# Patient Record
Sex: Female | Born: 1965 | Race: White | Hispanic: No | Marital: Married | Smoking: Never smoker
Health system: Southern US, Community
[De-identification: ages and names within clinical notes are randomized; demographics above are authoritative.]

## PROBLEM LIST (undated history)

## (undated) DIAGNOSIS — N200 Calculus of kidney: Secondary | ICD-10-CM

## (undated) DIAGNOSIS — F329 Major depressive disorder, single episode, unspecified: Secondary | ICD-10-CM

## (undated) DIAGNOSIS — F419 Anxiety disorder, unspecified: Secondary | ICD-10-CM

## (undated) DIAGNOSIS — G43909 Migraine, unspecified, not intractable, without status migrainosus: Secondary | ICD-10-CM

## (undated) DIAGNOSIS — I10 Essential (primary) hypertension: Secondary | ICD-10-CM

## (undated) DIAGNOSIS — E039 Hypothyroidism, unspecified: Secondary | ICD-10-CM

## (undated) DIAGNOSIS — Z9889 Other specified postprocedural states: Secondary | ICD-10-CM

## (undated) DIAGNOSIS — F32A Depression, unspecified: Secondary | ICD-10-CM

## (undated) DIAGNOSIS — K219 Gastro-esophageal reflux disease without esophagitis: Secondary | ICD-10-CM

## (undated) HISTORY — DX: Calculus of kidney: N20.0

## (undated) HISTORY — DX: Migraine, unspecified, not intractable, without status migrainosus: G43.909

## (undated) HISTORY — DX: Essential (primary) hypertension: I10

## (undated) HISTORY — DX: Other specified postprocedural states: Z98.890

## (undated) HISTORY — DX: Gastro-esophageal reflux disease without esophagitis: K21.9

## (undated) HISTORY — PX: DILATION AND CURETTAGE OF UTERUS: SHX78

## (undated) HISTORY — PX: URETEROSCOPY WITH HOLMIUM LASER LITHOTRIPSY: SHX6645

---

## 1999-03-04 ENCOUNTER — Other Ambulatory Visit: Admission: RE | Admit: 1999-03-04 | Discharge: 1999-03-04 | Payer: Self-pay | Admitting: Obstetrics & Gynecology

## 2000-08-25 ENCOUNTER — Other Ambulatory Visit: Admission: RE | Admit: 2000-08-25 | Discharge: 2000-08-25 | Payer: Self-pay | Admitting: Obstetrics & Gynecology

## 2001-11-30 ENCOUNTER — Other Ambulatory Visit: Admission: RE | Admit: 2001-11-30 | Discharge: 2001-11-30 | Payer: Self-pay | Admitting: Obstetrics & Gynecology

## 2005-09-02 ENCOUNTER — Other Ambulatory Visit: Admission: RE | Admit: 2005-09-02 | Discharge: 2005-09-02 | Payer: Self-pay | Admitting: Obstetrics & Gynecology

## 2005-12-15 HISTORY — PX: LITHOTRIPSY: SUR834

## 2006-09-11 ENCOUNTER — Emergency Department (HOSPITAL_COMMUNITY): Admission: EM | Admit: 2006-09-11 | Discharge: 2006-09-11 | Payer: Self-pay | Admitting: Emergency Medicine

## 2006-10-06 ENCOUNTER — Ambulatory Visit (HOSPITAL_BASED_OUTPATIENT_CLINIC_OR_DEPARTMENT_OTHER): Admission: RE | Admit: 2006-10-06 | Discharge: 2006-10-06 | Payer: Self-pay | Admitting: Urology

## 2009-12-15 DIAGNOSIS — Z9889 Other specified postprocedural states: Secondary | ICD-10-CM

## 2009-12-15 HISTORY — DX: Other specified postprocedural states: Z98.890

## 2010-05-01 ENCOUNTER — Ambulatory Visit: Payer: Self-pay | Admitting: Internal Medicine

## 2010-05-03 ENCOUNTER — Encounter: Payer: Self-pay | Admitting: Internal Medicine

## 2010-09-27 ENCOUNTER — Encounter: Admission: RE | Admit: 2010-09-27 | Discharge: 2010-09-27 | Payer: Self-pay | Admitting: Cardiovascular Disease

## 2010-10-02 ENCOUNTER — Ambulatory Visit (HOSPITAL_COMMUNITY): Admission: RE | Admit: 2010-10-02 | Discharge: 2010-10-02 | Payer: Self-pay | Admitting: Cardiovascular Disease

## 2011-01-16 NOTE — Miscellaneous (Signed)
Summary: Orders Update pft charges  Clinical Lists Changes  Orders: Added new Service order of Carbon Monoxide diffusing w/capacity (94720) - Signed Added new Service order of Lung Volumes (94240) - Signed Added new Service order of Spirometry (Pre & Post) (94060) - Signed 

## 2011-02-24 ENCOUNTER — Ambulatory Visit (HOSPITAL_COMMUNITY): Payer: BC Managed Care – PPO | Attending: Internal Medicine

## 2011-02-24 DIAGNOSIS — R0609 Other forms of dyspnea: Secondary | ICD-10-CM

## 2011-02-24 DIAGNOSIS — R0602 Shortness of breath: Secondary | ICD-10-CM | POA: Insufficient documentation

## 2011-02-24 DIAGNOSIS — R0989 Other specified symptoms and signs involving the circulatory and respiratory systems: Secondary | ICD-10-CM

## 2011-02-26 LAB — PREGNANCY, URINE: Preg Test, Ur: NEGATIVE

## 2011-05-02 NOTE — Op Note (Signed)
NAMENINI, CAVAN              ACCOUNT NO.:  [id number]   MEDICAL RECORD NO.:  [id number]          PATIENT TYPE:  AMB   LOCATION:  [facility]                         FACILITY:  [facility]   PHYSICIAN:  [place] Service, M.D.DATE OF BIRTH:  1966/08/24   DATE OF PROCEDURE:  10/06/2006  DATE OF DISCHARGE:                                 OPERATIVE REPORT   PREOPERATIVE DIAGNOSIS:  A 4 x 5 mm left ureterovesical junction stone.   POSTOPERATIVE DIAGNOSIS:  A 4 x 5 mm left ureterovesical junction stone.  Intractable nausea, vomiting and pain.   PROCEDURE:  Cystoscopy, ureteroscopy, retrograde and stone manipulation.   SURGEON:  Boston Service, M.D.   ASSISTANT:  None.   ANESTHESIA:  General.   SPECIMENS:  A 4 x 5 mm stone removed intact.   ESTIMATED BLOOD LOSS:  Minimal.   COMPLICATIONS:  None obvious.   DESCRIPTION OF PROCEDURE:  The patient was prepped and draped in the dorsal  lithotomy position after institution of adequate level of general  anesthesia.  A well lubricated 21-French panendoscope was gently inserted at  the urethral meatus, normal urethra and sphincter.  Clear reflux right  orifice, minimal reflux left orifice.   Retrograde blocking catheter was selected, positioned at the right ureteral  orifice.  Normal course and caliber of the ureter, pelvis and calyces with  prompt drainage at 3-5 minutes.  Left retrograde was performed using similar  technique, 5 mm filling defect was identified within the distal ureter with  proximal hydronephrosis.  Guidewire was negotiated beyond the filling  defect, coiled nicely in the upper pole calyces.  There was prompt efflux of  concentrated urine from the left ureteral orifice.  A short 6-French  ureteroscope was then inserted.  Basket was used,  stone was negotiated into the basket and then gently withdrawn.  Ureteroscope was then reinserted.  There was no evidence of ureteral trauma  or retained stony fragments.  Ureteroscope  was then gently withdrawn.  Bladder was drained.  Cystoscope was removed.  The patient was returned to  recovery in satisfactory condition.           ______________________________  Boston Service, M.D.     RH/MEDQ  D:  10/06/2006  T:  10/07/2006  Job:  [id number]   cc:   Ilda Mori, M.D.  Fax: [phone]   Juline Patch, M.D.  Fax: [phone]

## 2015-07-02 ENCOUNTER — Other Ambulatory Visit: Payer: Self-pay | Admitting: Internal Medicine

## 2015-07-02 DIAGNOSIS — R945 Abnormal results of liver function studies: Secondary | ICD-10-CM

## 2015-07-05 ENCOUNTER — Ambulatory Visit
Admission: RE | Admit: 2015-07-05 | Discharge: 2015-07-05 | Disposition: A | Discharge status: Home / Self Care | Payer: BLUE CROSS/BLUE SHIELD | Source: Ambulatory Visit | Attending: Internal Medicine | Admitting: Internal Medicine

## 2015-07-05 DIAGNOSIS — R945 Abnormal results of liver function studies: Secondary | ICD-10-CM

## 2015-08-16 ENCOUNTER — Ambulatory Visit (INDEPENDENT_AMBULATORY_CARE_PROVIDER_SITE_OTHER): Payer: BLUE CROSS/BLUE SHIELD | Admitting: Internal Medicine

## 2015-08-16 ENCOUNTER — Encounter: Payer: Self-pay | Admitting: Internal Medicine

## 2015-08-16 VITALS — BP 128/88 | HR 74 | Ht 62.0 in | Wt 170.0 lb

## 2015-08-16 DIAGNOSIS — R0689 Other abnormalities of breathing: Secondary | ICD-10-CM

## 2015-08-16 DIAGNOSIS — R230 Cyanosis: Secondary | ICD-10-CM

## 2015-08-16 DIAGNOSIS — R0600 Dyspnea, unspecified: Secondary | ICD-10-CM | POA: Diagnosis not present

## 2015-08-16 NOTE — Progress Notes (Signed)
Subjective:    Patient ID: Shelley Zimmerman, female    DOB: 1966-06-09, 49 y.o.   MRN: [id number]  HPI PC Shelley Gravel, MD Cards - Milinda Pointer 08/16/2015  Chief Complaint  Patient presents with  . Pulmonary Consult    Pt referred by Dr. Maudie Mercury for DOE x 6 years. Pt c/o throat clearing and chest tightness with exertion.     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 Here for dyspnea evaluation. Insidious onset of dyspnea since 2011. Back in 2011 she recollects pulmonary function tests, cardiac stress test, possibly cardiopulmonary stress test but apparently all these were normal. These electronic medical records are not fully available to me. A limited addition it was available in the old chart showed that she had a positive cardiac stress test and was then subjected to cardiac catheterization by Dr. Donnella Bi in 2011 and this was normal along with normal renal arteries and left ventricular ejection fraction. Since then she's gained 20-30 pounds and the dyspnea continues. Dyspnea is of moderate intensity. Brought on with heat, exertion but relieved by rest. Previous albuterol therapy has not helped. There is no associated wheezing or cough or orthopnea paroxysmal nocturnal dyspnea or edema or chest pain.  Associated with this she has some mild occasional spring allergies but she is also reporting a chronic history of random onset of blue lips, blue fingers and blue toes that happen intermittently. She brought this to the attention of her primary care physician Dr. Jani Zimmerman recently on visit 06/28/2015. This is been going on for 10 years or so. This history was confirmed on review of his referral note dated 06/28/2015. Apparently a lot of blood work was done but she does not know the results. It is unclear if autoimmune lab tests were done.  She's also been diagnosed with mildly elevated transaminases on July 2016 and an ultrasound same month 2016 shows fatty liver. Apparently she had a cardiac  echo by Dr. Christen Butter and she supposed to have a leaky mitral valve. That result is not available to me  She supposedly had a chest x-ray and this result is unavailable to me but apparently there was something in the lung base. Review of chest x-ray 2007 that I personally visualized and also CT scan abdomen lung cut shows clear pulmonary parenchyma at that time  Lab work shows normal creatinine July 2016 normal CBC July 2016 and normal thyroid function July 2016 - PRIMARY care physician  She is entering menopause and denies being on any hormonal pills     has a past medical history of Hypertension; S/P cardiac cath (2011); Kidney stones; GERD (gastroesophageal reflux disease); and Migraine.   reports that she has never smoked. She has never used smokeless tobacco.  Past Surgical History  Procedure Laterality Date  . Lithotripsy  2007    Allergies  Allergen Reactions  . Diovan [Valsartan]     Facial swelling     There is no immunization history on file for this patient.  Family History  Problem Relation Age of Onset  . Diabetes Mellitus II Father   . Hypertension Father   . Hypothyroidism Sister      Current outpatient prescriptions:  .  Cholecalciferol (VITAMIN D3) 5000 UNITS CAPS, Take by mouth daily., Disp: , Rfl:  .  magnesium oxide (MAG-OX) 400 MG tablet, Take 400 mg by mouth daily., Disp: , Rfl:  .  Multiple Vitamin (MULTIVITAMIN) capsule, Take 1 capsule by mouth daily., Disp: , Rfl:  .  olmesartan (BENICAR) 40 MG tablet, Take 40 mg by mouth daily., Disp: , Rfl:  .  valACYclovir (VALTREX) 1000 MG tablet, Take 1,000 mg by mouth 2 (two) times daily as needed., Disp: , Rfl:      Review of Systems  Constitutional: Negative for fever and unexpected weight change.  HENT: Negative for congestion, dental problem, ear pain, nosebleeds, postnasal drip, rhinorrhea, sinus pressure, sneezing, sore throat and trouble swallowing.   Eyes: Negative for redness and itching.    Respiratory: Positive for cough, chest tightness and shortness of breath. Negative for wheezing.   Cardiovascular: Negative for palpitations and leg swelling.  Gastrointestinal: Negative for nausea and vomiting.  Genitourinary: Negative for dysuria.  Musculoskeletal: Negative for joint swelling.  Skin: Negative for rash.  Neurological: Negative for headaches.  Hematological: Does not bruise/bleed easily.  Psychiatric/Behavioral: Negative for dysphoric mood. The patient is not nervous/anxious.        Objective:   Physical Exam  Constitutional: She is oriented to person, place, and time. She appears well-developed and well-nourished. No distress.  Body mass index is 31.09 kg/(m^2).   HENT:  Head: Normocephalic and atraumatic.  Right Ear: External ear normal.  Left Ear: External ear normal.  Mouth/Throat: Oropharynx is clear and moist. No oropharyngeal exudate.  Eyes: Conjunctivae and EOM are normal. Pupils are equal, round, and reactive to light. Right eye exhibits no discharge. Left eye exhibits no discharge. No scleral icterus.  Neck: Normal range of motion. Neck supple. No JVD present. No tracheal deviation present. No thyromegaly present.  Cardiovascular: Normal rate, regular rhythm, normal heart sounds and intact distal pulses.  Exam reveals no gallop and no friction rub.   No murmur heard. Pulmonary/Chest: Effort normal and breath sounds normal. No respiratory distress. She has no wheezes. She has no rales. She exhibits no tenderness.  Abdominal: Soft. Bowel sounds are normal. She exhibits no distension and no mass. There is no tenderness. There is no rebound and no guarding.  Visceral obesity +  Musculoskeletal: Normal range of motion. She exhibits no edema or tenderness.  Lymphadenopathy:    She has no cervical adenopathy.  Neurological: She is alert and oriented to person, place, and time. She has normal reflexes. No cranial nerve deficit. She exhibits normal muscle tone.  Coordination normal.  Skin: Skin is warm and dry. No rash noted. She is not diaphoretic. No erythema. No pallor.  Psychiatric: She has a normal mood and affect. Her behavior is normal. Judgment and thought content normal.  Vitals reviewed.   Filed Vitals:   08/16/15 0949  BP: 128/88  Pulse: 74  Height: 5\' 2"  (1.575 m)  Weight: 170 lb (77.111 kg)  SpO2: 99%         Assessment & Plan:     ICD-9-CM ICD-10-CM   1. Dyspnea and respiratory abnormality 786.09 R06.00 Pulmonary Function Test    R06.89   2. Blue lips 782.5 R23.0    Unclear cause of dyspnea but obesity, physical deconditioning and diastolic dysfunction are classic in the settinged clinical profile. Exercise induced bronchospasm could be another possibility. The abnormal chest x-ray she describes a primary care physician could reflect basilar atelectasis or early interstitial lung disease especially in the context of female, age 58s and history of Raynaud  Pretest probably ready for pulmonary embolism is low  Plan  - We'll check with primary care physician as dictated autoimmune profile if not we will check that  - Start with pulmonary function test - if these are normal  we'll proceed with methacholine challenge test, high-resolution CT scan of the chest and then subsequently cardiopulmonary stress test in that sequential order.   -She will call our office after every test to discuss the next step in her workup. She is agreeable with this plan       Dr. Brand Males, M.D., [facility].C.P Pulmonary and Critical Care Medicine Staff Physician [place] [facility] Pager: [phone], If no answer or between  15:00h - 7:00h: call [phone]  08/16/2015 10:53 AM

## 2015-08-16 NOTE — Patient Instructions (Addendum)
ICD-9-CM ICD-10-CM   1. Dyspnea and respiratory abnormality 786.09 R06.00     R06.89   2. Blue lips 782.5 R23.0     - do full PFT - cal [place] back [phone] to inform and then we will discuss and inform next step  - might involve CT chest or methacholine challenge test or CPST bike test  - My nurse  Will check with PCP [name], MD if you had autoimmune workup  - if not,  We will order this for you  Followup  - cal [place] after PFT test

## 2015-08-23 ENCOUNTER — Ambulatory Visit (HOSPITAL_COMMUNITY)
Admission: RE | Admit: 2015-08-23 | Discharge: 2015-08-23 | Disposition: A | Discharge status: Home / Self Care | Payer: BLUE CROSS/BLUE SHIELD | Source: Ambulatory Visit | Attending: Internal Medicine | Admitting: Internal Medicine

## 2015-08-23 ENCOUNTER — Telehealth: Payer: Self-pay | Admitting: Internal Medicine

## 2015-08-23 DIAGNOSIS — R0689 Other abnormalities of breathing: Secondary | ICD-10-CM | POA: Insufficient documentation

## 2015-08-23 DIAGNOSIS — R0600 Dyspnea, unspecified: Secondary | ICD-10-CM

## 2015-08-23 LAB — PULMONARY FUNCTION TEST
DL/VA % pred: 93 %
DL/VA: 4.29 ml/min/mmHg/L
DLCO UNC: 17.53 ml/min/mmHg
DLCO unc % pred: 78 %
FEF 25-75 Post: 4.04 L/sec
FEF 25-75 Pre: 5.13 L/sec
FEF2575-%Change-Post: -21 %
FEF2575-%PRED-PRE: 189 %
FEF2575-%Pred-Post: 149 %
FEV1-%CHANGE-POST: -3 %
FEV1-%Pred-Post: 107 %
FEV1-%Pred-Pre: 110 %
FEV1-Post: 2.88 L
FEV1-Pre: 2.97 L
FEV1FVC-%Change-Post: 0 %
FEV1FVC-%Pred-Pre: 115 %
FEV6-%CHANGE-POST: -2 %
FEV6-%Pred-Post: 95 %
FEV6-%Pred-Pre: 97 %
FEV6-Post: 3.15 L
FEV6-Pre: 3.22 L
FEV6FVC-%PRED-POST: 102 %
FEV6FVC-%Pred-Pre: 102 %
FVC-%Change-Post: -2 %
FVC-%Pred-Post: 93 %
FVC-%Pred-Pre: 95 %
FVC-Post: 3.15 L
FVC-Pre: 3.23 L
PRE FEV1/FVC RATIO: 92 %
Post FEV1/FVC ratio: 92 %
Post FEV6/FVC ratio: 100 %
Pre FEV6/FVC Ratio: 100 %
RV % pred: 83 %
RV: 1.42 L
TLC % pred: 91 %
TLC: 4.4 L

## 2015-08-23 MED ORDER — ALBUTEROL SULFATE (2.5 MG/3ML) 0.083% IN NEBU
2.5000 mg | INHALATION_SOLUTION | Freq: Once | RESPIRATORY_TRACT | Status: AC
  Administered 2015-08-23: 2.5 mg via RESPIRATORY_TRACT

## 2015-08-23 NOTE — Telephone Encounter (Signed)
Left detailed message on voicemail advising patient that we would contact her when Dr. Chase Caller has reviewed her PFT from today.  Nothing further needed.

## 2015-08-29 ENCOUNTER — Telehealth: Payer: Self-pay | Admitting: Internal Medicine

## 2015-08-29 DIAGNOSIS — R0600 Dyspnea, unspecified: Secondary | ICD-10-CM

## 2015-08-29 NOTE — Telephone Encounter (Signed)
Called Dr. Julianne Rice office at [phone] and left message for Lattie Haw to see if pt has had an autoimmune panel at that office.

## 2015-08-30 NOTE — Telephone Encounter (Signed)
Pt calling to let dr know that she had test done and is wanting to know results when they come in, please advise she can be reached @ [phone].Shelley Zimmerman

## 2015-08-30 NOTE — Telephone Encounter (Signed)
Called and spoke with pt Pt inquiring about PFT results, not allergy test Informed pt that MR is on vacation this week and would review once he returns to office Pt upset stating she doesn't understand why she has waited over a week for the results Assured pt that as soon as MR resulted PFT office would call her with the results  Will send to MR's nurse for follow up as per pt request

## 2015-09-03 NOTE — Telephone Encounter (Signed)
Elise/Triage   Please let patient know that I had to go to [place] on family issues. My grandmother died. My apologies for delay.PFT looks normal to me except maybe a trace problem where there is some in-efficiency in o2 transfer from lung to blood vessel. It is 78% DLCO, Normal 80% but it could easily be normal variation too.   Start with High Resolution CT chest without contrast on ILD protocol. Only  Dr Lorin Picket or Dr. Vinnie Langton to read - indication dyspnea. I have oredered already  Will call her with results once CT done If normal then will do methacholine chalenge test or CPST bike test  Thanks  Dr. Brand Males, M.D., [facility].C.P Pulmonary and Critical Care Medicine Staff Physician [place] [facility] Pager: [phone], If no answer or between  15:00h - 7:00h: call [phone]  09/03/2015 11:47 PM

## 2015-09-04 NOTE — Telephone Encounter (Signed)
lmtcb for pt.  

## 2015-09-05 NOTE — Telephone Encounter (Signed)
lmtcb for pt on both numbers.  

## 2015-09-05 NOTE — Telephone Encounter (Signed)
Called and spoke with pt and she is aware of results of the PFT per MR.  She stated that the CT has already been scheduled and she will wait to hear back from MR about these results. Nothing further is needed.

## 2015-09-05 NOTE — Telephone Encounter (Signed)
Pt returned call [phone]

## 2015-09-13 ENCOUNTER — Ambulatory Visit (INDEPENDENT_AMBULATORY_CARE_PROVIDER_SITE_OTHER)
Admission: RE | Admit: 2015-09-13 | Discharge: 2015-09-13 | Disposition: A | Discharge status: Home / Self Care | Payer: BLUE CROSS/BLUE SHIELD | Source: Ambulatory Visit | Attending: Internal Medicine | Admitting: Internal Medicine

## 2015-09-13 ENCOUNTER — Inpatient Hospital Stay: Admission: RE | Admit: 2015-09-13 | Payer: BLUE CROSS/BLUE SHIELD | Source: Ambulatory Visit

## 2015-09-13 DIAGNOSIS — R0600 Dyspnea, unspecified: Secondary | ICD-10-CM | POA: Diagnosis not present

## 2015-09-14 ENCOUNTER — Telehealth: Payer: Self-pay | Admitting: Internal Medicine

## 2015-09-14 DIAGNOSIS — R0600 Dyspnea, unspecified: Secondary | ICD-10-CM

## 2015-09-14 NOTE — Telephone Encounter (Signed)
Please call Shelley Zimmerman 09/14/2015 and let her know that CT scan of the chest does not show any interstitial lung disease or explain her symptoms of dyspnea. Also reviewed primary care physician lab results-no evidence of autoimmune lab tests done  LAN - Do limited autoimmune profile of ANA, double-stranded DNA, rheumatoid factor, CCP, SCL-70, SSA/SSB, Anka screen, PR 3, MPO - indication of the lips and unexpected dyspnea  -Do cardiopulmonary stress test with excess induced bronchospastic challenge  Follow-up to see me after the above   Ct Chest High Resolution  09/13/2015   CLINICAL DATA:  49 year old female with 5-6 year history of shortness breath with exertion, worsening over the past 6 months. Intermittent decreased oxygen saturations, with fingernails and toes turning blue. Evaluate for interstitial lung disease. Nonsmoker.  EXAM: CT CHEST WITHOUT CONTRAST  TECHNIQUE: Multidetector CT imaging of the chest was performed following the standard protocol without intravenous contrast. High resolution imaging of the lungs, as well as inspiratory and expiratory imaging, was performed.  COMPARISON:  No priors.  FINDINGS: Mediastinum/Lymph Nodes: Heart size is normal. There is no significant pericardial fluid, thickening or pericardial calcification. No pathologically enlarged mediastinal or hilar lymph nodes. Please note that accurate exclusion of hilar adenopathy is limited on noncontrast CT scans. Esophagus is unremarkable in appearance. No axillary lymphadenopathy.  Lungs/Pleura: High-resolution images demonstrate no significant regions of ground-glass attenuation, subpleural reticulation, parenchymal banding, traction bronchiectasis or frank honeycombing. Inspiratory and expiratory imaging demonstrates mild air trapping, indicative of mild small airways disease. No acute consolidative airspace disease. No pleural effusions. No suspicious appearing pulmonary nodules or masses.  Upper abdomen:  Multiple small low-attenuation lesions in the liver are incompletely characterized on today's noncontrast CT examination, but statistically likely cysts, with the largest lesion measuring 1.3 cm in diameter in segment 2.  Musculoskeletal: There are no aggressive appearing lytic or blastic lesions noted in the visualized portions of the skeleton.  IMPRESSION: 1. No findings to suggest interstitial lung disease. 2. Mild air trapping, indicative of mild small airways disease. 3. Incidental findings, as above.   Electronically Signed   By: Vinnie Langton M.D.   On: 09/13/2015 14:49

## 2015-09-17 NOTE — Telephone Encounter (Signed)
Called and spoke to pt. Informed her of the results and recs per MR. Orders placed. Pt verbalized understanding and denied any further questions or concerns at this time.    PCC's please advise when CPST has been scheduled. Thanks.

## 2015-09-18 ENCOUNTER — Telehealth: Payer: Self-pay | Admitting: *Deleted

## 2015-09-18 NOTE — Telephone Encounter (Signed)
Pt returning call and can be reached @ [phone].Shelley Zimmerman

## 2015-09-18 NOTE — Telephone Encounter (Signed)
cpst 09/24/15@[facility] 

## 2015-09-18 NOTE — Telephone Encounter (Signed)
Called spoke with pt. She is scheduled for CPST Thursday 10/6 at 3.  She needs appt scheduled. She can do anything after 4 on mon, tues, wed but prefers thurs/friday appt. Please advise Daneil Dan thanks

## 2015-09-18 NOTE — Telephone Encounter (Signed)
Pt is going to call once she is home to r/s her CPST bc she will be in class on 10/10. FYI for elise as she was calling to make pt an appt for f/u. Pt reports once she r.s's she will call back for appt.

## 2015-09-18 NOTE — Telephone Encounter (Signed)
lmtcb for pt. Need to make appt with MR, can use held slot on 10.11.2016.

## 2015-09-20 ENCOUNTER — Other Ambulatory Visit: Payer: BLUE CROSS/BLUE SHIELD

## 2015-09-20 ENCOUNTER — Ambulatory Visit (HOSPITAL_COMMUNITY): Payer: BLUE CROSS/BLUE SHIELD | Attending: Internal Medicine

## 2015-09-20 DIAGNOSIS — R0600 Dyspnea, unspecified: Secondary | ICD-10-CM | POA: Diagnosis not present

## 2015-09-20 LAB — RHEUMATOID FACTOR: Rheumatoid fact SerPl-aCnc: <10 IU/mL (ref <=14)

## 2015-09-20 NOTE — Telephone Encounter (Signed)
lmtcb x1 for pt. 

## 2015-09-20 NOTE — Telephone Encounter (Signed)
Please see other phone message from 9.30.2016. Will sign off.

## 2015-09-20 NOTE — Telephone Encounter (Signed)
Next available opening is 11.10.2016.  MR please advise if this appt date is ok. If date is too far out please advise if pt can be seen by TP. Thanks.

## 2015-09-20 NOTE — Telephone Encounter (Signed)
This should be fine.   Thanks  Dr. Brand Males, M.D., [facility].C.P Pulmonary and Critical Care Medicine Staff Physician [place] [facility] Pager: [phone], If no answer or between  15:00h - 7:00h: call [phone]  09/20/2015 11:23 AM

## 2015-09-21 LAB — SJOGRENS SYNDROME-B EXTRACTABLE NUCLEAR ANTIBODY: SSB (La) (ENA) Antibody, IgG: <1

## 2015-09-21 LAB — MPO/PR-3 (ANCA) ANTIBODIES
Myeloperoxidase Abs: <1
Serine Protease 3: <1

## 2015-09-21 LAB — ANA: Anti Nuclear Antibody(ANA): NEGATIVE

## 2015-09-21 LAB — ANTI-SCLERODERMA ANTIBODY: Scleroderma (Scl-70) (ENA) Antibody, IgG: <1

## 2015-09-21 LAB — SJOGRENS SYNDROME-A EXTRACTABLE NUCLEAR ANTIBODY: SSA (Ro) (ENA) Antibody, IgG: <1

## 2015-09-21 LAB — CYCLIC CITRUL PEPTIDE ANTIBODY, IGG: Cyclic Citrullin Peptide Ab: <16 Units

## 2015-09-21 LAB — ANCA SCREEN W REFLEX TITER: ANCA Screen: NEGATIVE

## 2015-09-21 LAB — ANTI-DNA ANTIBODY, DOUBLE-STRANDED: ds DNA Ab: <1 IU/mL

## 2015-09-21 NOTE — Telephone Encounter (Signed)
Made the pt an appt and she is aware

## 2015-09-24 ENCOUNTER — Encounter (HOSPITAL_COMMUNITY): Payer: BLUE CROSS/BLUE SHIELD

## 2015-09-27 ENCOUNTER — Telehealth: Payer: Self-pay | Admitting: Internal Medicine

## 2015-09-27 NOTE — Telephone Encounter (Signed)
Result Note     Autoimmune negative. Let her know I will be looking at cpst this week sometime and it could be next week before we call her back on that  ---  Called spoke with pt. She had CPST done on 09/24/15. Please advise MR thanks

## 2015-09-28 NOTE — Telephone Encounter (Signed)
Nira Conn, can you get Mrs Pylant in to see me in the next couple of weeks, needs to arrange [place] for pulmonary hypertension. s

## 2015-09-28 NOTE — Telephone Encounter (Signed)
Gave her CPST results =-  submaximal Vo2 despite good effort. Some correction with IBW and this would says obesity is cause. But end of test she got lightheaded and pulse ox 90%. [place] lightheaded. This can correlate with dlco 78% which is slightly low. Need to rule out pulm htn. Needs right heart cath. Will refer to Dr Loralie Champagne (has seen Gwenlyn Found before in 2011) for right heart cath. (might need with exercise) - she is ok seeing anyone and has been  > 3 years since she saw Dr Gwenlyn Found. Also avised to monittor with home pulse ox. If right heart cath normal then dyspnea down to obesity, diast dysfn  Else  = pls gget appt to see Dr Aundra Dubin - I have copied him  -= patient to return to see me only after right heart cath -for now keep nov fu with me but she will postpoine if needed

## 2015-10-01 NOTE — Telephone Encounter (Signed)
Will keep in my box till appt has been made.

## 2015-10-02 ENCOUNTER — Telehealth (HOSPITAL_COMMUNITY): Payer: Self-pay

## 2015-10-02 ENCOUNTER — Telehealth (HOSPITAL_COMMUNITY): Payer: Self-pay | Admitting: Vascular Surgery

## 2015-10-02 DIAGNOSIS — R0600 Dyspnea, unspecified: Secondary | ICD-10-CM | POA: Diagnosis not present

## 2015-10-02 NOTE — Telephone Encounter (Signed)
lmtcb for Heather, Dr. Claris Gladden nurse, to schedule appt.

## 2015-10-02 NOTE — Telephone Encounter (Signed)
Left pt message to make new pt appt w/ [facility] 

## 2015-10-02 NOTE — Telephone Encounter (Signed)
Received call from [facility] from Dr. Claris Gladden office. Stanton Kidney stated she will remind Heather of the appt. Will hold in my box till appt is made.

## 2015-10-02 NOTE — Telephone Encounter (Signed)
Alise from [facility] called [phone]) about referral to Dr. Aundra Dubin about right heart cath.   Would like to know from [place], when will this be?

## 2015-10-03 NOTE — Telephone Encounter (Signed)
appt sch 10/28 with Dr Aundra Dubin

## 2015-10-04 NOTE — Telephone Encounter (Signed)
appt 10/28 with Dr Aundra Dubin

## 2015-10-12 ENCOUNTER — Encounter (HOSPITAL_COMMUNITY): Payer: Self-pay | Admitting: *Deleted

## 2015-10-12 ENCOUNTER — Encounter (HOSPITAL_COMMUNITY): Payer: Self-pay

## 2015-10-12 ENCOUNTER — Ambulatory Visit (HOSPITAL_COMMUNITY)
Admission: RE | Admit: 2015-10-12 | Discharge: 2015-10-12 | Disposition: A | Discharge status: Home / Self Care | Payer: BLUE CROSS/BLUE SHIELD | Source: Ambulatory Visit | Attending: Cardiology | Admitting: Cardiology

## 2015-10-12 VITALS — BP 114/72 | HR 70 | Wt 171.0 lb

## 2015-10-12 DIAGNOSIS — I10 Essential (primary) hypertension: Secondary | ICD-10-CM | POA: Insufficient documentation

## 2015-10-12 DIAGNOSIS — Z79899 Other long term (current) drug therapy: Secondary | ICD-10-CM | POA: Diagnosis not present

## 2015-10-12 DIAGNOSIS — K219 Gastro-esophageal reflux disease without esophagitis: Secondary | ICD-10-CM | POA: Diagnosis not present

## 2015-10-12 DIAGNOSIS — R0689 Other abnormalities of breathing: Secondary | ICD-10-CM

## 2015-10-12 DIAGNOSIS — Z8249 Family history of ischemic heart disease and other diseases of the circulatory system: Secondary | ICD-10-CM | POA: Insufficient documentation

## 2015-10-12 DIAGNOSIS — R0600 Dyspnea, unspecified: Secondary | ICD-10-CM | POA: Diagnosis present

## 2015-10-12 LAB — BASIC METABOLIC PANEL
Anion gap: 6 (ref 5–15)
BUN: 12 mg/dL (ref 6–20)
CHLORIDE: 103 mmol/L (ref 101–111)
CO2: 24 mmol/L (ref 22–32)
Calcium: 8.8 mg/dL — ABNORMAL LOW (ref 8.9–10.3)
Creatinine, Ser: 0.71 mg/dL (ref 0.44–1.00)
GFR calc non Af Amer: >60 mL/min (ref >60)
Glucose, Bld: 104 mg/dL — ABNORMAL HIGH (ref 65–99)
Potassium: 4.2 mmol/L (ref 3.5–5.1)
SODIUM: 133 mmol/L — AB (ref 135–145)

## 2015-10-12 LAB — PROTIME-INR
INR: 1.09 (ref 0.00–1.49)
Prothrombin Time: 14.3 seconds (ref 11.6–15.2)

## 2015-10-12 LAB — CBC
HEMATOCRIT: 39.1 % (ref 36.0–46.0)
Hemoglobin: 13.2 g/dL (ref 12.0–15.0)
MCH: 31 pg (ref 26.0–34.0)
MCHC: 33.8 g/dL (ref 30.0–36.0)
MCV: 91.8 fL (ref 78.0–100.0)
Platelets: 220 10*3/uL (ref 150–400)
RBC: 4.26 MIL/uL (ref 3.87–5.11)
RDW: 13.2 % (ref 11.5–15.5)
WBC: 4.9 10*3/uL (ref 4.0–10.5)

## 2015-10-12 LAB — BRAIN NATRIURETIC PEPTIDE: B Natriuretic Peptide: 6.7 pg/mL (ref 0.0–100.0)

## 2015-10-12 NOTE — H&P (Signed)
Patient ID: Shelley Zimmerman, female   DOB: [birth year], 49 y.o.   MRN: [id number] PCP: Dr. Maudie Mercury Pulmonary: Dr. Chase Caller  49 yo with long history of dyspnea presents for evaluation for possible RHC.  She was seen back in 2011 by Dr Gwenlyn Found when her dyspnea first developed.  She had a left heart cath done at that time that showed no significant coronary disease (apparently had abnormal stress test).  Since then, she has continued to have exertional dyspnea though fairly mild.  Over the last 6 months - 1 year, her symptoms have been worse.  She is now short of breath walking up hills or up steps.  She had to stop "15 times" walking up [place] recently, when in the past she had been able to go up without much problem.  No chest pain.  She tends to do ok on flat ground.  No orthopnea/PND.  No marked weight gain.  No wheezing.  She has been to see Dr. Chase Caller and has had a fairly extensive workup.  PFTs were normal except for abnormal DLCO, suggesting possible pulmonary vascular disease.  CPX showed functional capacity comparable to matched sedentary norms, but oxygen saturation was noted to drop to 90% in recovery.  Again, there is concern for pulmonary vascular disease. High resolution chest CT was not suggestive of interstitial lung disease.  Husband has noted that her lips and fingers are blue-tinged at times, but there is no definite trigger to this (not triggered by cold or exertion).   Labs (10/16): ANA negative, anti-dsDNA negative, RF < 10, CCP negative, SCL-70 negative, SSA/SSB negative, ANCA negative.   ECG: NSR, normal  PMH: 1. GERD 2. Nephrolithiasis 3. Raynauds syndrome 4. HTN 5. LHC (2011) with no significant coronary disease.  6. Dyspnea: PFTs (9/16) with FVC 95%, FEV1 110%, ratio 115%, TLC 91%, DLCO 98% => normal spirometry, mild diffuse deficit (?pulmonary vascular disease).  High resolution CT chest (9/16) with no interstitial lung disease.  CPX (10/16) with peak VO2 17, VE/VCO2 slope  33.5, RER 1.14 => normal compared to matched sedentary norms but oxygen saturation decreased to 90% during recovery, concern for pulmonary vascular disease.    SH: Nonsmoker, no drugs, rare ETOH, in school for massage therapy, lives in [place].  FH: Mother died at 71, sounds like sudden cardiac death (per patient, her "heart exploded.").   ROS: All systems reviewed and negative except as per HPI.   Current Outpatient Prescriptions  Medication Sig Dispense Refill  . magnesium oxide (MAG-OX) 400 MG tablet Take 400 mg by mouth daily.    . Multiple Vitamin (MULTIVITAMIN) capsule Take 1 capsule by mouth daily.    Marland Kitchen olmesartan (BENICAR) 40 MG tablet Take 40 mg by mouth daily.    . Cholecalciferol (VITAMIN D3) 5000 UNITS CAPS Take by mouth daily.    . valACYclovir (VALTREX) 1000 MG tablet Take 1,000 mg by mouth 2 (two) times daily as needed.     No current facility-administered medications for this encounter.   BP 114/72 mmHg  Pulse 70  Wt 171 lb (77.565 kg)  SpO2 98% General: NAD Neck: No JVD, no thyromegaly or thyroid nodule.  Lungs: Clear to auscultation bilaterally with normal respiratory effort. CV: Nondisplaced PMI.  Heart regular S1/S2, no S3/S4, no murmur.  No peripheral edema.  No carotid bruit.  Normal pedal pulses.  Abdomen: Soft, nontender, no hepatosplenomegaly, no distention.  Skin: Intact without lesions or rashes.  Neurologic: Alert and oriented x 3.  Psych: Normal affect.  Extremities: No clubbing or cyanosis.  HEENT: Normal.   Assessment/Plan: 1. Dyspnea: Chronic dyspnea x at least 6 years, but insidiously worse over the last year. She had coronary angiography when symptoms began in 2010 that showed no significant CAD.  Workup, as above, has been concerning for possible pulmonary vascular disease.  No ILD on chest CT.  - I am going to arrange for an echo for noninvasive evaluation of pulmonary pressure and to assess the RV.  - I will have her get a BNP drawn today.   - I think that she merits a right heart cath.  This will allow [place] to definitively see if she has pulmonary hypertension as a cause for her symptoms.  If pulmonary hypertension is noted to be present, would likely do adenosine challenge.  I may also need to do arm exercises with IV bags during the procedure to try to bring out [place].  I discussed the procedure and its risks/benefits with her today, and she agrees to proceed.  - If PAH is found, will arrange for 6 minute walk.  2. FH cardiac disease: Mother had sudden cardiac death at 57.  Would make sure she has had lipids checked recently and controlled.   Loralie Champagne 10/12/2015

## 2015-10-12 NOTE — Patient Instructions (Signed)
Labs today will call if abnormal   Will schedule you for a right heart catherization  Will get labs from Dr Maudie Mercury for our records  Schedule you for an echocardiogram at our [address] office   Follow up in 3 weeks   Echocardiogram An echocardiogram, or echocardiography, uses sound waves (ultrasound) to produce an image of your heart. The echocardiogram is simple, painless, obtained within a short period of time, and offers valuable information to your health care provider. The images from an echocardiogram can provide information such as:  Evidence of coronary artery disease (CAD).  Heart size.  Heart muscle function.  Heart valve function.  Aneurysm detection.  Evidence of a past heart attack.  Fluid buildup around the heart.  Heart muscle thickening.  Assess heart valve function. LET [facility] CARE PROVIDER KNOW ABOUT:  Any allergies you have.  All medicines you are taking, including vitamins, herbs, eye drops, creams, and over-the-counter medicines.  Previous problems you or members of your family have had with the use of anesthetics.  Any blood disorders you have.  Previous surgeries you have had.  Medical conditions you have.  Possibility of pregnancy, if this applies. BEFORE THE PROCEDURE  No special preparation is needed. Eat and drink normally.  PROCEDURE   In order to produce an image of your heart, gel will be applied to your chest and a wand-like tool (transducer) will be moved over your chest. The gel will help transmit the sound waves from the transducer. The sound waves will harmlessly bounce off your heart to allow the heart images to be captured in real-time motion. These images will then be recorded.  You may need an IV to receive a medicine that improves the quality of the pictures. AFTER THE PROCEDURE You may return to your normal schedule including diet, activities, and medicines, unless your health care provider tells you otherwise.   This  information is not intended to replace advice given to you by your health care provider. Make sure you discuss any questions you have with your health care provider.   Document Released: 11/28/2000 Document Revised: 12/22/2014 Document Reviewed: 08/08/2013 Elsevier Interactive Patient Education 2016 [place]. Coronary Angiogram A coronary angiogram, also called coronary angiography, is an X-ray procedure used to look at the arteries in the heart. In this procedure, a dye (contrast dye) is injected through a long, hollow tube (catheter). The catheter is about the size of a piece of cooked spaghetti and is inserted through your groin, wrist, or arm. The dye is injected into each artery, and X-rays are then taken to show if there is a blockage in the arteries of your heart. LET [facility] CARE PROVIDER KNOW ABOUT:  Any allergies you have, including allergies to shellfish or contrast dye.   All medicines you are taking, including vitamins, herbs, eye drops, creams, and over-the-counter medicines.   Previous problems you or members of your family have had with the use of anesthetics.   Any blood disorders you have.   Previous surgeries you have had.  History of kidney problems or failure.   Other medical conditions you have. RISKS AND COMPLICATIONS  Generally, a coronary angiogram is a safe procedure. However, problems can occur and include:  Allergic reaction to the dye.  Bleeding from the access site or other locations.  Kidney injury, especially in people with impaired kidney function.  Stroke (rare).  Heart attack (rare). BEFORE THE PROCEDURE   Do not eat or drink anything after midnight  the night before the procedure or as directed by your health care provider.   Ask your health care provider about changing or stopping your regular medicines. This is especially important if you are taking diabetes medicines or blood thinners. PROCEDURE  You may be given a medicine  to help you relax (sedative) before the procedure. This medicine is given through an intravenous (IV) access tube that is inserted into one of your veins.   The area where the catheter will be inserted will be washed and shaved. This is usually done in the groin but may be done in the fold of your arm (near your elbow) or in the wrist.   A medicine will be given to numb the area where the catheter will be inserted (local anesthetic).   The health care provider will insert the catheter into an artery. The catheter will be guided by using a special type of X-ray (fluoroscopy) of the blood vessel being examined.   A special dye will then be injected into the catheter, and X-rays will be taken. The dye will help to show where any narrowing or blockages are located in the heart arteries.  AFTER THE PROCEDURE   If the procedure is done through the leg, you will be kept in bed lying flat for several hours. You will be instructed to not bend or cross your legs.  The insertion site will be checked frequently.   The pulse in your feet or wrist will be checked frequently.   Additional blood tests, X-rays, and an electrocardiogram may be done.    This information is not intended to replace advice given to you by your health care provider. Make sure you discuss any questions you have with your health care provider.   Document Released: 06/07/2003 Document Revised: 12/22/2014 Document Reviewed: 04/25/2013 Elsevier Interactive Patient Education [employer].

## 2015-10-16 ENCOUNTER — Telehealth (HOSPITAL_COMMUNITY): Payer: Self-pay | Admitting: Vascular Surgery

## 2015-10-16 NOTE — Telephone Encounter (Signed)
Pt left message wanting to know if she could she resch her cath on Monday 10/22/15 to Thursday 11/10 or Friday 10/26/15.Marland Kitchen Please advise

## 2015-10-16 NOTE — Addendum Note (Signed)
Encounter addended by: Patton Salles, RN on: 10/16/2015  3:20 PM<BR>     Documentation filed: [employer], Orders

## 2015-10-18 ENCOUNTER — Other Ambulatory Visit (HOSPITAL_COMMUNITY): Payer: Self-pay | Admitting: *Deleted

## 2015-10-18 ENCOUNTER — Telehealth (HOSPITAL_COMMUNITY): Payer: Self-pay | Admitting: Cardiology

## 2015-10-18 DIAGNOSIS — R0602 Shortness of breath: Secondary | ICD-10-CM

## 2015-10-18 NOTE — Telephone Encounter (Signed)
resch to 11/17

## 2015-10-18 NOTE — Telephone Encounter (Signed)
Patient scheduled for RHC with Dr.Mclean on 10/22/15 Cpt code 93453 With patients current insurance-[employer] No pre cert required

## 2015-10-19 ENCOUNTER — Telehealth: Payer: Self-pay | Admitting: Internal Medicine

## 2015-10-19 NOTE — Telephone Encounter (Signed)
LMTCB pt.  Will need to move pt's appointment from 10/25/2015 to after 11/01/2015. When scheduling the appt will also need to schedule 6MWT. Can use 11/22 held spot.

## 2015-10-19 NOTE — Telephone Encounter (Signed)
6MWT and OV scheduled for 11/06/15, pt aware. Nothing further needed at this time.

## 2015-10-25 ENCOUNTER — Other Ambulatory Visit: Payer: Self-pay

## 2015-10-25 ENCOUNTER — Ambulatory Visit (HOSPITAL_COMMUNITY): Payer: BLUE CROSS/BLUE SHIELD | Attending: Cardiology

## 2015-10-25 ENCOUNTER — Ambulatory Visit: Payer: BLUE CROSS/BLUE SHIELD | Admitting: Internal Medicine

## 2015-10-25 DIAGNOSIS — R0600 Dyspnea, unspecified: Secondary | ICD-10-CM | POA: Insufficient documentation

## 2015-10-25 DIAGNOSIS — R0689 Other abnormalities of breathing: Secondary | ICD-10-CM

## 2015-10-25 DIAGNOSIS — I10 Essential (primary) hypertension: Secondary | ICD-10-CM | POA: Insufficient documentation

## 2015-10-25 DIAGNOSIS — R931 Abnormal findings on diagnostic imaging of heart and coronary circulation: Secondary | ICD-10-CM | POA: Diagnosis not present

## 2015-10-26 ENCOUNTER — Other Ambulatory Visit (HOSPITAL_COMMUNITY): Payer: Self-pay

## 2015-11-01 ENCOUNTER — Encounter (HOSPITAL_COMMUNITY)
Admission: RE | Disposition: A | Discharge status: Home / Self Care | Payer: BLUE CROSS/BLUE SHIELD | Source: Ambulatory Visit | Attending: Cardiology

## 2015-11-01 ENCOUNTER — Ambulatory Visit (HOSPITAL_COMMUNITY)
Admission: RE | Admit: 2015-11-01 | Discharge: 2015-11-01 | Disposition: A | Discharge status: Home / Self Care | Payer: BLUE CROSS/BLUE SHIELD | Source: Ambulatory Visit | Attending: Cardiology | Admitting: Cardiology

## 2015-11-01 DIAGNOSIS — E860 Dehydration: Secondary | ICD-10-CM | POA: Insufficient documentation

## 2015-11-01 DIAGNOSIS — I272 Other secondary pulmonary hypertension: Secondary | ICD-10-CM | POA: Diagnosis present

## 2015-11-01 DIAGNOSIS — I270 Primary pulmonary hypertension: Secondary | ICD-10-CM | POA: Diagnosis not present

## 2015-11-01 DIAGNOSIS — R0602 Shortness of breath: Secondary | ICD-10-CM

## 2015-11-01 HISTORY — PX: CARDIAC CATHETERIZATION: SHX172

## 2015-11-01 LAB — POCT I-STAT 3, VENOUS BLOOD GAS (G3P V)
Acid-base deficit: 2 mmol/L (ref 0.0–2.0)
BICARBONATE: 23 meq/L (ref 20.0–24.0)
BICARBONATE: 25 meq/L — AB (ref 20.0–24.0)
O2 SAT: 63 %
O2 Saturation: 61 %
PCO2 VEN: 41 mmHg — AB (ref 45.0–50.0)
PCO2 VEN: 41.9 mmHg — AB (ref 45.0–50.0)
PH VEN: 7.358 — AB (ref 7.250–7.300)
PO2 VEN: 33 mmHg (ref 30.0–45.0)
TCO2: 24 mmol/L (ref 0–100)
TCO2: 26 mmol/L (ref 0–100)
pH, Ven: 7.384 — ABNORMAL HIGH (ref 7.250–7.300)
pO2, Ven: 33 mmHg (ref 30.0–45.0)

## 2015-11-01 LAB — BASIC METABOLIC PANEL
Anion gap: 7 (ref 5–15)
BUN: 11 mg/dL (ref 6–20)
CHLORIDE: 106 mmol/L (ref 101–111)
CO2: 24 mmol/L (ref 22–32)
Calcium: 9.3 mg/dL (ref 8.9–10.3)
Creatinine, Ser: 0.73 mg/dL (ref 0.44–1.00)
GFR calc Af Amer: >60 mL/min (ref >60)
GFR calc non Af Amer: >60 mL/min (ref >60)
GLUCOSE: 102 mg/dL — AB (ref 65–99)
POTASSIUM: 4.1 mmol/L (ref 3.5–5.1)
Sodium: 137 mmol/L (ref 135–145)

## 2015-11-01 LAB — CBC
HCT: 39.8 % (ref 36.0–46.0)
HEMOGLOBIN: 13.6 g/dL (ref 12.0–15.0)
MCH: 31.3 pg (ref 26.0–34.0)
MCHC: 34.2 g/dL (ref 30.0–36.0)
MCV: 91.7 fL (ref 78.0–100.0)
Platelets: 224 10*3/uL (ref 150–400)
RBC: 4.34 MIL/uL (ref 3.87–5.11)
RDW: 13 % (ref 11.5–15.5)
WBC: 4.5 10*3/uL (ref 4.0–10.5)

## 2015-11-01 LAB — PROTIME-INR
INR: 1.08 (ref 0.00–1.49)
Prothrombin Time: 14.2 seconds (ref 11.6–15.2)

## 2015-11-01 LAB — PREGNANCY, URINE: Preg Test, Ur: NEGATIVE

## 2015-11-01 SURGERY — RIGHT HEART CATH
Anesthesia: LOCAL

## 2015-11-01 MED ORDER — SODIUM CHLORIDE 0.9 % IJ SOLN: 3.0000 mL | Freq: Two times a day (BID) | INTRAMUSCULAR | Status: DC

## 2015-11-01 MED ORDER — SODIUM CHLORIDE 0.9 % IV SOLN: 250.0000 mL | INTRAVENOUS | Status: DC | PRN

## 2015-11-01 MED ORDER — ONDANSETRON HCL 4 MG/2ML IJ SOLN: 4.0000 mg | Freq: Four times a day (QID) | INTRAMUSCULAR | Status: DC | PRN

## 2015-11-01 MED ORDER — ACETAMINOPHEN 325 MG PO TABS: 650.0000 mg | ORAL_TABLET | ORAL | Status: DC | PRN

## 2015-11-01 MED ORDER — SODIUM CHLORIDE 0.9 % IV SOLN
INTRAVENOUS | Status: DC
  Administered 2015-11-01: 250 mL via INTRAVENOUS
  Administered 2015-11-01: 12:00:00 via INTRAVENOUS

## 2015-11-01 MED ORDER — MIDAZOLAM HCL 2 MG/2ML IJ SOLN
INTRAMUSCULAR | Status: DC | PRN
Start: 2015-11-01 — End: 2015-11-01
  Administered 2015-11-01: 1 mg via INTRAVENOUS

## 2015-11-01 MED ORDER — FENTANYL CITRATE (PF) 100 MCG/2ML IJ SOLN
INTRAMUSCULAR | Status: AC
  Filled 2015-11-01: qty 2

## 2015-11-01 MED ORDER — SODIUM CHLORIDE 0.9 % IV SOLN: INTRAVENOUS | Status: DC

## 2015-11-01 MED ORDER — MIDAZOLAM HCL 2 MG/2ML IJ SOLN
INTRAMUSCULAR | Status: AC
  Filled 2015-11-01: qty 2

## 2015-11-01 MED ORDER — LIDOCAINE HCL (PF) 1 % IJ SOLN
INTRAMUSCULAR | Status: AC
  Filled 2015-11-01: qty 30

## 2015-11-01 MED ORDER — ASPIRIN 81 MG PO CHEW
81.0000 mg | CHEWABLE_TABLET | ORAL | Status: AC
  Administered 2015-11-01: 81 mg via ORAL

## 2015-11-01 MED ORDER — SODIUM CHLORIDE 0.9 % IJ SOLN: 3.0000 mL | INTRAMUSCULAR | Status: DC | PRN

## 2015-11-01 MED ORDER — FENTANYL CITRATE (PF) 100 MCG/2ML IJ SOLN
INTRAMUSCULAR | Status: DC | PRN
  Administered 2015-11-01: 25 ug via INTRAVENOUS

## 2015-11-01 SURGICAL SUPPLY — 8 items
CATH SWAN GANZ 7F STRAIGHT (CATHETERS) ×1 IMPLANT
KIT HEART LEFT (KITS) ×2 IMPLANT
KIT HEART RIGHT NAMIC (KITS) ×2 IMPLANT
NDL PERC 18GX7CM (NEEDLE) IMPLANT
NEEDLE PERC 18GX7CM (NEEDLE) ×2 IMPLANT
PACK CARDIAC CATHETERIZATION (CUSTOM PROCEDURE TRAY) ×2 IMPLANT
SHEATH PINNACLE 7F 10CM (SHEATH) ×2 IMPLANT
TRANSDUCER W/STOPCOCK (MISCELLANEOUS) ×3 IMPLANT

## 2015-11-01 NOTE — Discharge Instructions (Signed)
Angiogram, Care After °Refer to this sheet in the next few weeks. These instructions provide you with information about caring for yourself after your procedure. Your health care provider may also give you more specific instructions. Your treatment has been planned according to current medical practices, but problems sometimes occur. Call your health care provider if you have any problems or questions after your procedure. °WHAT TO EXPECT AFTER THE PROCEDURE °After your procedure, it is typical to have the following: °· Bruising at the catheter insertion site that usually fades within 1-2 weeks. °· Blood collecting in the tissue (hematoma) that may be painful to the touch. It should usually decrease in size and tenderness within 1-2 weeks. °HOME CARE INSTRUCTIONS °· Take medicines only as directed by your health care provider. °· You may shower 24-48 hours after the procedure or as directed by your health care provider. Remove the bandage (dressing) and gently wash the site with plain soap and water. Pat the area dry with a clean towel. Do not rub the site, because this may cause bleeding. °· Do not take baths, swim, or use a hot tub until your health care provider approves. °· Check your insertion site every day for redness, swelling, or drainage. °· Do not apply powder or lotion to the site. °· Do not lift over 10 lb (4.5 kg) for 5 days after your procedure or as directed by your health care provider. °· Ask your health care provider when it is okay to: °¨ Return to work or school. °¨ Resume usual physical activities or sports. °¨ Resume sexual activity. °· Do not drive home if you are discharged the same day as the procedure. Have someone else drive you. °· You may drive 24 hours after the procedure unless otherwise instructed by your health care provider. °· Do not operate machinery or power tools for 24 hours after the procedure or as directed by your health care provider. °· If your procedure was done as an  outpatient procedure, which means that you went home the same day as your procedure, a responsible adult should be with you for the first 24 hours after you arrive home. °· Keep all follow-up visits as directed by your health care provider. This is important. °SEEK MEDICAL CARE IF: °· You have a fever. °· You have chills. °· You have increased bleeding from the catheter insertion site. Hold pressure on the site. °SEEK IMMEDIATE MEDICAL CARE IF: °· You have unusual pain at the catheter insertion site. °· You have redness, warmth, or swelling at the catheter insertion site. °· You have drainage (other than a small amount of blood on the dressing) from the catheter insertion site. °· The catheter insertion site is bleeding, and the bleeding does not stop after 30 minutes of holding steady pressure on the site. °· The area near or just beyond the catheter insertion site becomes pale, cool, tingly, or numb. °  °This information is not intended to replace advice given to you by your health care provider. Make sure you discuss any questions you have with your health care provider. °  °Document Released: 06/19/2005 Document Revised: 12/22/2014 Document Reviewed: 05/04/2013 °Elsevier Interactive Patient Education ©2016 Elsevier Inc. ° °

## 2015-11-02 ENCOUNTER — Encounter (HOSPITAL_COMMUNITY): Payer: BLUE CROSS/BLUE SHIELD

## 2015-11-02 ENCOUNTER — Encounter (HOSPITAL_COMMUNITY): Payer: Self-pay | Admitting: Cardiology

## 2015-11-02 MED FILL — Lidocaine HCl Local Preservative Free (PF) Inj 1%: INTRAMUSCULAR | Qty: 30 | Status: AC

## 2015-11-06 ENCOUNTER — Ambulatory Visit (INDEPENDENT_AMBULATORY_CARE_PROVIDER_SITE_OTHER): Payer: BLUE CROSS/BLUE SHIELD | Admitting: Internal Medicine

## 2015-11-06 ENCOUNTER — Encounter: Payer: Self-pay | Admitting: Internal Medicine

## 2015-11-06 VITALS — BP 116/86 | HR 89 | Ht 62.0 in | Wt 174.0 lb

## 2015-11-06 DIAGNOSIS — R42 Dizziness and giddiness: Secondary | ICD-10-CM | POA: Diagnosis not present

## 2015-11-06 DIAGNOSIS — R0600 Dyspnea, unspecified: Secondary | ICD-10-CM | POA: Diagnosis not present

## 2015-11-06 DIAGNOSIS — R0689 Other abnormalities of breathing: Secondary | ICD-10-CM | POA: Diagnosis not present

## 2015-11-06 NOTE — Patient Instructions (Signed)
ICD-9-CM ICD-10-CM   1. Dyspnea and respiratory abnormality 786.09 R06.00     R06.89   2. Dizziness and giddiness 780.4 R42     #Shortness of breath - Unclear cause but probably related to weight and related diastolic dysfunction - Take low glycemic diet and eat foods only on the left lane - lose weight using smart phone app called my fitnesspal - Work out in the gym 3 days a week using a heart rate monitor and maybe implying a Physiological scientist  #Dizziness - Refer to Dr. Posey Pronto neurology  #Follow-up - 1 year or sooner if needed

## 2015-11-06 NOTE — Progress Notes (Signed)
Subjective:     Patient ID: Shelley Zimmerman, female   DOB: 06/20/1966, 49 y.o.   MRN: [id number]  HPI  PC [name], MD Cards - Milinda Pointer 08/16/2015  Chief Complaint  Patient presents with  . Pulmonary Consult    Pt referred by Dr. Maudie Mercury for DOE x 6 years. Pt c/o throat clearing and chest tightness with exertion.     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 Here for dyspnea evaluation. Insidious onset of dyspnea since 2011. Back in 2011 she recollects pulmonary function tests, cardiac stress test, possibly cardiopulmonary stress test but apparently all these were normal. These electronic medical records are not fully available to me. A limited addition it was available in the old chart showed that she had a positive cardiac stress test and was then subjected to cardiac catheterization by Dr. Donnella Bi in 2011 and this was normal along with normal renal arteries and left ventricular ejection fraction. Since then she's gained 20-30 pounds and the dyspnea continues. Dyspnea is of moderate intensity. Brought on with heat, exertion but relieved by rest. Previous albuterol therapy has not helped. There is no associated wheezing or cough or orthopnea paroxysmal nocturnal dyspnea or edema or chest pain.  Associated with this she has some mild occasional spring allergies but she is also reporting a chronic history of random onset of blue lips, blue fingers and blue toes that happen intermittently. She brought this to the attention of her primary care physician Dr. [name] recently on visit 06/28/2015. This is been going on for 10 years or so. This history was confirmed on review of his referral note dated 06/28/2015. Apparently a lot of blood work was done but she does not know the results. It is unclear if autoimmune lab tests were done.  She's also been diagnosed with mildly elevated transaminases on July 2016 and an ultrasound same month 2016 shows fatty liver. Apparently she had a cardiac  echo by Dr. Christen Butter and she supposed to have a leaky mitral valve. That result is not available to me  She supposedly had a chest x-ray and this result is unavailable to me but apparently there was something in the lung base. Review of chest x-ray 2007 that I personally visualized and also CT scan abdomen lung cut shows clear pulmonary parenchyma at that time  Lab work shows normal creatinine July 2016 normal CBC July 2016 and normal thyroid function July 2016 - PRIMARY care physician  She is entering menopause and denies being on any hormonal pills     has a past medical history of Hypertension; S/P cardiac cath (2011); Kidney stones; GERD (gastroesophageal reflux disease); and Migraine.   reports that she has never smoked. She has never used smokeless tobacco.  OV 11/06/2015  Chief Complaint  Patient presents with  . Follow-up    Pt here after cardiac cath, echo, 6MWT, CPST. Pt states her breathing is unchanged since last OV. Pt denies cough and CP/tightness.    unexplained dyspnea follow-up. Here to discuss the following results that are detailed below. They're all normal. She is frustrated by her symptoms. Today at 6 minute walk test she said she could feel that she could not take a deep breath and had dyspnea. She also said that mountain climbing in September 2016 took her a long time. In addition she started having dizziness. She's had episodes of dizziness when she tilts her head in different directions and is wondering what the relationship of that her  dyspnea would be   09/20/15 - Autoimmune - negative  09/24/15 - > CPST results =-  submaximal Vo2 despite good effort. Some correction with IBW and this would says obesity is cause. But end of test she got lightheaded and pulse ox 90%. Says climbing mountain recently made her lightheaded. This can correlate with dlco 78% which is slightly low. Need to rule out pulm htn. Needs right heart cath. Will refer to Dr Loralie Champagne (has seen  Gwenlyn Found before in 2011) for right heart cath. (might need with exercise) - she is ok seeing anyone and has been  > 3 years since she saw Dr Gwenlyn Found. Also avised to monittor with home pulse ox. If right heart cath normal then dyspnea down to obesity, diast dysfn   Right heart cath = essentially normal. In fact the pressures were low and she got some fluids.  11/01/15 RHC Procedural Findings: Hemodynamics (mmHg) RA mean 3 RV 18/1 PA 17/3, mean 9 PCWP mean 3  Oxygen saturations: PA 62% AO 99%  Cardiac Output (Fick) 3.44  Cardiac Index (Fick) 1.94  Cardiac Output (Thermo): 3.66 Cardiac Index (Thermo): 2.07  Review of Systems     Objective:   Physical Exam Filed Vitals:   11/06/15 1403  BP: 116/86  Pulse: 89  Height: 5\' 2"  (1.575 m)  Weight: 174 lb (78.926 kg)  SpO2: 98%    Discussion only visit    Assessment:       ICD-9-CM ICD-10-CM   1. Dyspnea and respiratory abnormality 786.09 R06.00     R06.89   2. Dizziness and giddiness 780.4 R42        Plan:      In terms of dyspnea: I think obesity and diastolic dysfunction are playing a role. I recommended pulmonary rehabilitation but has scheduled will not allow this. I recommended a low glycemic diet and given her diet sheet to lose weight. I also asked her to imply a physical trainer and work out in [employer]. This will be a long process and I've asked her to be patient, persistent and with a lot of discipline. We will see her back in one year  In terms of dizziness: I do not know the relationship of this to dyspnea. It appears that tilting her head in different directions causes the dizziness. I think given the fact there is some symptomatic correlation with dyspnea and the dizziness is unexplained we will have Dr. Posey Pronto within our neurology group evaluate her  Follow-up 1 year  > 50% of this > 25 min visit spent in face to face counseling or coordination of care    Dr. Brand Males, M.D., [facility].C.P Pulmonary and  Critical Care Medicine Staff Physician [place] [facility] Pager: [phone], If no answer or between  15:00h - 7:00h: call [phone]  11/06/2015 2:27 PM

## 2015-11-07 ENCOUNTER — Telehealth: Payer: Self-pay | Admitting: Internal Medicine

## 2015-11-07 DIAGNOSIS — R42 Dizziness and giddiness: Secondary | ICD-10-CM

## 2015-11-07 NOTE — Progress Notes (Signed)
6mwd 

## 2015-11-07 NOTE — Telephone Encounter (Signed)
lmtcb x1 for pt. 

## 2015-11-09 NOTE — Telephone Encounter (Signed)
Patient returned call [phone]

## 2015-11-09 NOTE — Telephone Encounter (Signed)
Refer [place] neurology

## 2015-11-09 NOTE — Telephone Encounter (Signed)
Left message for pt to call back to inform.

## 2015-11-09 NOTE — Telephone Encounter (Signed)
Called and spoke with pt Pt states that the earliest she can get in with Dr Posey Pronto is 01/09/16 Pt states that she would like to see neurologist and have MRI done before end of year due to insurance Pt would like MR rec of another neurologist that she can get in with before the end of the year  MR, please advise. Thanks

## 2015-11-09 NOTE — Telephone Encounter (Signed)
Pt is aware and referral placed. Nothing further needed

## 2015-11-16 ENCOUNTER — Ambulatory Visit (HOSPITAL_COMMUNITY)
Admission: RE | Admit: 2015-11-16 | Discharge: 2015-11-16 | Disposition: A | Discharge status: Home / Self Care | Payer: BLUE CROSS/BLUE SHIELD | Source: Ambulatory Visit | Attending: Cardiology | Admitting: Cardiology

## 2015-11-16 DIAGNOSIS — R0600 Dyspnea, unspecified: Secondary | ICD-10-CM

## 2015-11-16 DIAGNOSIS — Z79899 Other long term (current) drug therapy: Secondary | ICD-10-CM | POA: Diagnosis not present

## 2015-11-16 DIAGNOSIS — I10 Essential (primary) hypertension: Secondary | ICD-10-CM | POA: Diagnosis not present

## 2015-11-16 DIAGNOSIS — R42 Dizziness and giddiness: Secondary | ICD-10-CM | POA: Diagnosis not present

## 2015-11-16 DIAGNOSIS — R0689 Other abnormalities of breathing: Secondary | ICD-10-CM | POA: Diagnosis not present

## 2015-11-16 DIAGNOSIS — Z87442 Personal history of urinary calculi: Secondary | ICD-10-CM | POA: Insufficient documentation

## 2015-11-16 DIAGNOSIS — I7300 Raynaud's syndrome without gangrene: Secondary | ICD-10-CM | POA: Insufficient documentation

## 2015-11-16 DIAGNOSIS — I501 Left ventricular failure: Secondary | ICD-10-CM | POA: Insufficient documentation

## 2015-11-16 DIAGNOSIS — K219 Gastro-esophageal reflux disease without esophagitis: Secondary | ICD-10-CM | POA: Insufficient documentation

## 2015-11-18 NOTE — Addendum Note (Signed)
Encounter addended by: Larey Dresser, MD on: 11/18/2015 11:30 AM<BR>     Documentation filed: Clinical Notes

## 2015-11-18 NOTE — Progress Notes (Signed)
Patient ID: Shelley Zimmerman, female   DOB: 09/19/1966, 49 y.o.   MRN: [id number] PCP: Dr. Maudie Mercury Pulmonary: Dr. Chase Caller  49 yo with long history of dyspnea presented initially for evaluation for possible RHC. She was seen back in 2011 by Dr Gwenlyn Found when her dyspnea first developed. She had a left heart cath done at that time that showed no significant coronary disease (apparently had abnormal stress test). Since then, she has continued to have exertional dyspnea though fairly mild. Over the last 6 months - 1 year, her symptoms have been worse. She is now short of breath walking up hills or up steps. She had to stop "15 times" walking up [place] this fall, when in the past she had been able to go up without much problem. No chest pain. She tends to do ok on flat ground. No orthopnea/PND. No marked weight gain. No wheezing. She has been to see Dr. Chase Caller and has had a fairly extensive workup. PFTs were normal except for abnormal DLCO, suggesting possible pulmonary vascular disease. CPX showed functional capacity comparable to matched sedentary norms, but oxygen saturation was noted to drop to 90% in recovery. Again, there is concern for pulmonary vascular disease. High resolution chest CT was not suggestive of interstitial lung disease. Husband has noted that her lips and fingers are blue-tinged at times, but there is no definite trigger to this (not triggered by cold or exertion).   I took her for [place] in 11/16.  This was not suggestive of CHF or pulmonary hypertension. Cardiac output was low, but I think she was dehydrated (PCWP 3).  Echo showed normal EF.    In addition to the dyspnea, she has had vertigo-type symptoms that are positional (head turned a particular way).   Labs (10/16): ANA negative, anti-dsDNA negative, RF < 10, CCP negative, SCL-70 negative, SSA/SSB negative, ANCA negative. BNP 6.7.   ECG: NSR, normal  PMH: 1. GERD 2. Nephrolithiasis 3. Raynauds syndrome 4.  HTN 5. LHC (2011) with no significant coronary disease.  6. Dyspnea: PFTs (9/16) with FVC 95%, FEV1 110%, ratio 115%, TLC 91%, DLCO 98% => normal spirometry, mild diffuse deficit (?pulmonary vascular disease). High resolution CT chest (9/16) with no interstitial lung disease. CPX (10/16) with peak VO2 17, VE/VCO2 slope 33.5, RER 1.14 => normal compared to matched sedentary norms but oxygen saturation decreased to 90% during recovery, concern for pulmonary vascular disease. BNP (10/16) 6.7.  RHC (11/16) mean RA 3, PA 17/3, PCWP 3, CI 1.94 (dehydrated).  Echo (11/16) with EF 65-70%.   7. Positional vertigo.   SH: Nonsmoker, no drugs, rare ETOH, in school for massage therapy, lives in [place].  FH: Mother died at 32, sounds like sudden cardiac death (per patient, her "heart exploded.").   ROS: All systems reviewed and negative except as per HPI.   Current Outpatient Prescriptions  Medication Sig Dispense Refill  . clonazePAM (KLONOPIN) 0.5 MG tablet Take 0.5 mg by mouth at bedtime as needed (For sleep.).     Marland Kitchen Multiple Vitamin (MULTIVITAMIN) capsule Take 1 capsule by mouth daily.    Marland Kitchen olmesartan (BENICAR) 40 MG tablet Take 40 mg by mouth daily.    . valACYclovir (VALTREX) 1000 MG tablet Take 1,000 mg by mouth 2 (two) times daily as needed.     No current facility-administered medications for this encounter.   BP 98/76 HR 82 General: NAD Neck: No JVD, no thyromegaly or thyroid nodule.  Lungs: Clear to auscultation bilaterally with normal respiratory effort.  CV: Nondisplaced PMI.  Heart regular S1/S2, no S3/S4, no murmur.  No peripheral edema.  No carotid bruit.  Normal pedal pulses.  Abdomen: Soft, nontender, no hepatosplenomegaly, no distention.  Skin: Intact without lesions or rashes.  Neurologic: Alert and oriented x 3.  Psych: Normal affect. Extremities: No clubbing or cyanosis. Right groin cath site benign.  HEENT: Normal.   1. Dyspnea: Chronic dyspnea x at least 6 years, but  insidiously worse over the last year. She had coronary angiography when symptoms began in 2010 that showed no significant CAD. Workup, as above, has been concerning for possible pulmonary vascular disease. No ILD on chest CT. Her right heart cath, however, showed low PA pressure as well as low filling pressures (actually looked like she was dehydrated so I gave her some IV fluid).  Echo was unremarkable and BNP was normal.  I cannot find a cardiac explanation for her symptoms.  I wonder if this is not due mainly to weight gain and deconditioning.  We talked about getting more exercise regularly and working on weight loss.   2. FH cardiac disease: Mother had sudden cardiac death at 84. Would make sure she has had lipids checked recently and controlled.  3. Dizziness: I think she has positional vertigo.  She has a neurology appointment.   She can followup with me as needed.   Loralie Champagne 11/18/2015 11:29 AM

## 2015-11-20 NOTE — H&P (Signed)
Shelley Zimmerman is an 49 y.o. female. She is admitted for hysteroscopy, endometrial ablation, and possible submucous myomectomy  Pertinent Gynecological History: Menses: flow is excessive with use of 10 pads or tampons on heaviest days Last mammogram: normal Date: 08/2015 Last pap: normal Date: 08/2015 OB History: G4, P1031    Past Medical History  Diagnosis Date  . Hypertension   . S/P cardiac cath 2011  . Kidney stones   . GERD (gastroesophageal reflux disease)   . Migraine     Past Surgical History  Procedure Laterality Date  . Lithotripsy  2007  . Cardiac catheterization N/A 11/01/2015    Procedure: Right Heart Cath;  Surgeon: Larey Dresser, MD;  Location: [facility];  Service: Cardiovascular;  Laterality: N/A;    Family History  Problem Relation Age of Onset  . Diabetes Mellitus II Father   . Hypertension Father   . Hypothyroidism Sister     Social History:  reports that she has never smoked. She has never used smokeless tobacco. She reports that she drinks alcohol. She reports that she does not use illicit drugs.  Allergies:  Allergies  Allergen Reactions  . Diovan [Valsartan] Swelling    Facial swelling    No prescriptions prior to admission    Review of Systems  Constitutional: Negative.   HENT: Negative.   Eyes: Negative.   Respiratory: Negative.   Cardiovascular: Negative.   Gastrointestinal: Negative.   Genitourinary: Negative.   Skin: Negative.   Endo/Heme/Allergies: Negative.   Psychiatric/Behavioral: Negative.     There were no vitals taken for this visit. Physical Exam  Constitutional: She appears well-developed and well-nourished.  HENT:  Head: Normocephalic.  Eyes: Pupils are equal, round, and reactive to light.  Neck: Normal range of motion.  Cardiovascular: Normal rate.   Respiratory: Effort normal.  GI: Soft.  Genitourinary: Vagina normal.  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   Neurological: She is alert.  Skin: Skin is warm.    Office ultrasound reveals multiple intramural myoma with distortion of endometrial canal.  No definite submucous myoma identified on saline infusion.  Assessment/Plan: Symptomatic menorrhagia.  Responds to hormonal therapy but patient cannot tolerate hormonal side effects of nausea and decreased well being.  Will proceed with endometrial ablation.  If submucous myoma are found on hysteroscopy these will be resected.  Korie Streat D 11/20/2015, 9:22 PM

## 2015-11-21 ENCOUNTER — Encounter (HOSPITAL_COMMUNITY): Payer: Self-pay

## 2015-11-21 ENCOUNTER — Encounter (HOSPITAL_COMMUNITY)
Admission: RE | Admit: 2015-11-21 | Discharge: 2015-11-21 | Disposition: A | Discharge status: Home / Self Care | Payer: BLUE CROSS/BLUE SHIELD | Source: Ambulatory Visit | Attending: Obstetrics & Gynecology | Admitting: Obstetrics & Gynecology

## 2015-11-21 DIAGNOSIS — I10 Essential (primary) hypertension: Secondary | ICD-10-CM | POA: Diagnosis not present

## 2015-11-21 DIAGNOSIS — K219 Gastro-esophageal reflux disease without esophagitis: Secondary | ICD-10-CM | POA: Diagnosis not present

## 2015-11-21 DIAGNOSIS — Z8249 Family history of ischemic heart disease and other diseases of the circulatory system: Secondary | ICD-10-CM | POA: Diagnosis not present

## 2015-11-21 DIAGNOSIS — N920 Excessive and frequent menstruation with regular cycle: Secondary | ICD-10-CM | POA: Diagnosis present

## 2015-11-21 DIAGNOSIS — D259 Leiomyoma of uterus, unspecified: Secondary | ICD-10-CM | POA: Diagnosis not present

## 2015-11-21 HISTORY — DX: Hypothyroidism, unspecified: E03.9

## 2015-11-21 HISTORY — DX: Major depressive disorder, single episode, unspecified: F32.9

## 2015-11-21 HISTORY — DX: Depression, unspecified: F32.A

## 2015-11-21 HISTORY — DX: Anxiety disorder, unspecified: F41.9

## 2015-11-21 NOTE — Patient Instructions (Signed)
Your procedure is scheduled on: November 23, 2015   Enter through the Main Entrance of [facility] at: 7:30 am   Pick up the phone at the desk and dial [phone].  Call this number if you have problems the morning of surgery: [phone].  Remember: Do NOT eat food: after midnight on Thursday  Do NOT drink clear liquids after: after midnight on Thursday  Take these medicines the morning of surgery with a SIP OF WATER: Benicar   Do NOT wear jewelry (body piercing), metal hair clips/bobby pins, make-up, or nail polish. Do NOT wear lotions, powders, or perfumes.  You may wear deoderant. Do NOT shave for 48 hours prior to surgery. Do NOT bring valuables to the hospital. Contacts, dentures, or bridgework may not be worn into surgery. Have a responsible adult drive you home and stay with you for 24 hours after your procedure

## 2015-11-23 ENCOUNTER — Ambulatory Visit (HOSPITAL_COMMUNITY): Payer: BLUE CROSS/BLUE SHIELD | Admitting: Anesthesiology

## 2015-11-23 ENCOUNTER — Encounter (HOSPITAL_COMMUNITY)
Admission: RE | Disposition: A | Discharge status: Home / Self Care | Payer: Self-pay | Source: Ambulatory Visit | Attending: Obstetrics & Gynecology

## 2015-11-23 ENCOUNTER — Ambulatory Visit (HOSPITAL_COMMUNITY)
Admission: RE | Admit: 2015-11-23 | Discharge: 2015-11-23 | Disposition: A | Discharge status: Home / Self Care | Payer: BLUE CROSS/BLUE SHIELD | Source: Ambulatory Visit | Attending: Obstetrics & Gynecology | Admitting: Obstetrics & Gynecology

## 2015-11-23 ENCOUNTER — Encounter (HOSPITAL_COMMUNITY): Payer: Self-pay | Admitting: *Deleted

## 2015-11-23 DIAGNOSIS — Z8249 Family history of ischemic heart disease and other diseases of the circulatory system: Secondary | ICD-10-CM | POA: Insufficient documentation

## 2015-11-23 DIAGNOSIS — I10 Essential (primary) hypertension: Secondary | ICD-10-CM | POA: Insufficient documentation

## 2015-11-23 DIAGNOSIS — N920 Excessive and frequent menstruation with regular cycle: Secondary | ICD-10-CM | POA: Insufficient documentation

## 2015-11-23 DIAGNOSIS — D259 Leiomyoma of uterus, unspecified: Secondary | ICD-10-CM | POA: Diagnosis not present

## 2015-11-23 DIAGNOSIS — K219 Gastro-esophageal reflux disease without esophagitis: Secondary | ICD-10-CM | POA: Insufficient documentation

## 2015-11-23 HISTORY — PX: HYSTEROSCOPY WITH NOVASURE: SHX5574

## 2015-11-23 LAB — PREGNANCY, URINE: Preg Test, Ur: NEGATIVE

## 2015-11-23 SURGERY — HYSTEROSCOPY WITH NOVASURE
Anesthesia: General | Site: Vagina

## 2015-11-23 MED ORDER — MIDAZOLAM HCL 5 MG/5ML IJ SOLN
INTRAMUSCULAR | Status: DC | PRN
  Administered 2015-11-23: 2 mg via INTRAVENOUS

## 2015-11-23 MED ORDER — FENTANYL CITRATE (PF) 100 MCG/2ML IJ SOLN
INTRAMUSCULAR | Status: AC
  Filled 2015-11-23: qty 2

## 2015-11-23 MED ORDER — SCOPOLAMINE 1 MG/3DAYS TD PT72
1.0000 | MEDICATED_PATCH | Freq: Once | TRANSDERMAL | Status: DC
  Administered 2015-11-23: 1.5 mg via TRANSDERMAL

## 2015-11-23 MED ORDER — GLYCINE 1.5 % IR SOLN
Status: DC | PRN
  Administered 2015-11-23: 3000 mL

## 2015-11-23 MED ORDER — DEXAMETHASONE SODIUM PHOSPHATE 10 MG/ML IJ SOLN
INTRAMUSCULAR | Status: AC
  Filled 2015-11-23: qty 1

## 2015-11-23 MED ORDER — FENTANYL CITRATE (PF) 100 MCG/2ML IJ SOLN
INTRAMUSCULAR | Status: AC
  Administered 2015-11-23: 50 ug via INTRAVENOUS
  Filled 2015-11-23: qty 2

## 2015-11-23 MED ORDER — PROPOFOL 10 MG/ML IV BOLUS
INTRAVENOUS | Status: DC | PRN
  Administered 2015-11-23: 200 mg via INTRAVENOUS

## 2015-11-23 MED ORDER — KETOROLAC TROMETHAMINE 30 MG/ML IJ SOLN
INTRAMUSCULAR | Status: DC | PRN
  Administered 2015-11-23: 30 mg via INTRAVENOUS

## 2015-11-23 MED ORDER — MIDAZOLAM HCL 2 MG/2ML IJ SOLN
INTRAMUSCULAR | Status: AC
  Filled 2015-11-23: qty 2

## 2015-11-23 MED ORDER — EPHEDRINE SULFATE 50 MG/ML IJ SOLN
INTRAMUSCULAR | Status: DC | PRN
  Administered 2015-11-23 (×2): 5 mg via INTRAVENOUS

## 2015-11-23 MED ORDER — SCOPOLAMINE 1 MG/3DAYS TD PT72
MEDICATED_PATCH | TRANSDERMAL | Status: AC
  Administered 2015-11-23: 1.5 mg via TRANSDERMAL
  Filled 2015-11-23: qty 1

## 2015-11-23 MED ORDER — LIDOCAINE HCL 2 % IJ SOLN
INTRAMUSCULAR | Status: DC | PRN
  Administered 2015-11-23: 10 mL

## 2015-11-23 MED ORDER — LIDOCAINE HCL (CARDIAC) 20 MG/ML IV SOLN
INTRAVENOUS | Status: DC | PRN
  Administered 2015-11-23: 100 mg via INTRAVENOUS

## 2015-11-23 MED ORDER — DEXAMETHASONE SODIUM PHOSPHATE 4 MG/ML IJ SOLN
INTRAMUSCULAR | Status: DC | PRN
  Administered 2015-11-23: 10 mg via INTRAVENOUS

## 2015-11-23 MED ORDER — FENTANYL CITRATE (PF) 100 MCG/2ML IJ SOLN
INTRAMUSCULAR | Status: DC | PRN
  Administered 2015-11-23 (×2): 50 ug via INTRAVENOUS

## 2015-11-23 MED ORDER — LIDOCAINE HCL (CARDIAC) 20 MG/ML IV SOLN
INTRAVENOUS | Status: AC
  Filled 2015-11-23: qty 5

## 2015-11-23 MED ORDER — ONDANSETRON HCL 4 MG/2ML IJ SOLN
INTRAMUSCULAR | Status: AC
  Filled 2015-11-23: qty 2

## 2015-11-23 MED ORDER — EPHEDRINE 5 MG/ML INJ
INTRAVENOUS | Status: AC
  Filled 2015-11-23: qty 10

## 2015-11-23 MED ORDER — SODIUM CHLORIDE 0.9 % IR SOLN
Status: DC | PRN
  Administered 2015-11-23: 3000 mL

## 2015-11-23 MED ORDER — ONDANSETRON HCL 4 MG/2ML IJ SOLN: 4.0000 mg | Freq: Once | INTRAMUSCULAR | Status: DC | PRN

## 2015-11-23 MED ORDER — ONDANSETRON HCL 4 MG/2ML IJ SOLN
INTRAMUSCULAR | Status: DC | PRN
  Administered 2015-11-23: 4 mg via INTRAVENOUS

## 2015-11-23 MED ORDER — PROPOFOL 10 MG/ML IV BOLUS
INTRAVENOUS | Status: AC
  Filled 2015-11-23: qty 20

## 2015-11-23 MED ORDER — LACTATED RINGERS IV SOLN
INTRAVENOUS | Status: DC
  Administered 2015-11-23 (×2): via INTRAVENOUS

## 2015-11-23 MED ORDER — FENTANYL CITRATE (PF) 100 MCG/2ML IJ SOLN
25.0000 ug | INTRAMUSCULAR | Status: DC | PRN
  Administered 2015-11-23: 50 ug via INTRAVENOUS

## 2015-11-23 SURGICAL SUPPLY — 15 items
ABLATOR ENDOMETRIAL BIPOLAR (ABLATOR) ×2 IMPLANT
CATH ROBINSON RED A/P 16FR (CATHETERS) ×2 IMPLANT
CLOTH BEACON ORANGE TIMEOUT ST (SAFETY) ×2 IMPLANT
CONTAINER PREFILL 10% NBF 60ML (FORM) IMPLANT
ELECTRODE ROLLER BARREL 22FR (ELECTROSURGICAL) ×1 IMPLANT
GLOVE BIOGEL PI IND STRL 7.0 (GLOVE) ×1 IMPLANT
GLOVE BIOGEL PI INDICATOR 7.0 (GLOVE) ×2
GLOVE ECLIPSE 6.0 STRL STRAW (GLOVE) ×4 IMPLANT
GOWN STRL REUS W/TWL LRG LVL3 (GOWN DISPOSABLE) ×6 IMPLANT
PACK VAGINAL MINOR WOMEN LF (CUSTOM PROCEDURE TRAY) ×2 IMPLANT
PAD OB MATERNITY 4.3X12.25 (PERSONAL CARE ITEMS) ×2 IMPLANT
PAD PREP 24X48 CUFFED NSTRL (MISCELLANEOUS) ×2 IMPLANT
TOWEL OR 17X24 6PK STRL BLUE (TOWEL DISPOSABLE) ×4 IMPLANT
TUBING AQUILEX INFLOW (TUBING) ×2 IMPLANT
WATER STERILE IRR 1000ML POUR (IV SOLUTION) ×2 IMPLANT

## 2015-11-23 NOTE — Op Note (Signed)
Patient Name: Shelley Zimmerman MRN: [id number]  Date of Surgery: 11/23/2015    PREOPERATIVE DIAGNOSIS: MENORRHAGIA, Myoma Uteri  POSTOPERATIVE DIAGNOSIS: Menorrhagia, Myoma Uteri    PROCEDURE: Hysteroscopy, Endometrial curettage, Novasure and Rollerball endometrial ablation  SURGEON: Janice Coffin. Deatra Ina M.D.  ANESTHESIA: General  ESTIMATED BLOOD LOSS: Minimal  FINDINGS: Endometrial cavity was slightly distorted from intramural myoma but no polyps or submucous myoma were noted.   INDICATIONS: Menorrhagia  PROCEDURE IN DETAIL: The patient was taken to the OR and placed in the dors-lithotomy position. The perineum and vagina were prepped and draped in a sterile fashion. Bimanual exam revealed an anteverted,10-12 week sized uterus. 10 ml of 2% lidocaine was infiltrated in the paracervical tissue and Pratt dilators were used to open the cervix to 76 [place]. A diagnostic hysteroscope was used to evaluate the endometrial cavity.  The Novasure was placed but could not be opened more than 2.5 cm in width.  A sharp curettage was performed and the Novasure was again placed but  the maximum opening width remained only 2.5 cm.The ablation was carried out with a coagulation time of 40m 50 sec.  The cavity was reevaluated and it appeared that the fundal endometrium had not been adequately ablated.  The Roller ball was placed and the ablation was completed. The procedure was then terminated and the patient left the operating room in good condition.

## 2015-11-23 NOTE — Anesthesia Procedure Notes (Signed)
Procedure Name: LMA Insertion Date/Time: 11/23/2015 8:52 AM Performed by: Riki Sheer Pre-anesthesia Checklist: Patient identified, Emergency Drugs available, Suction available, Patient being monitored and Timeout performed Patient Re-evaluated:Patient Re-evaluated prior to inductionOxygen Delivery Method: Circle system utilized Preoxygenation: Pre-oxygenation with 100% oxygen Intubation Type: IV induction Ventilation: Mask ventilation without difficulty LMA: LMA inserted LMA Size: 4.0 Number of attempts: 1 Placement Confirmation: positive ETCO2,  CO2 detector and breath sounds checked- equal and bilateral Tube secured with: Tape Dental Injury: Teeth and Oropharynx as per pre-operative assessment

## 2015-11-23 NOTE — Discharge Instructions (Signed)
Hysteroscopy, Care After Refer to this sheet in the next few weeks. These instructions provide you with information on caring for yourself after your procedure. Your health care provider may also give you more specific instructions. Your treatment has been planned according to current medical practices, but problems sometimes occur. Call your health care provider if you have any problems or questions after your procedure.  WHAT TO EXPECT AFTER THE PROCEDURE After your procedure, it is typical to have the following:  You may have some cramping. This normally lasts for a couple days.  You may have bleeding. This can vary from light spotting for a few days to menstrual-like bleeding for 3-7 days. HOME CARE INSTRUCTIONS  Rest for the first 1-2 days after the procedure.  Only take over-the-counter or prescription medicines as directed by your health care provider. Do not take aspirin. It can increase the chances of bleeding.  Take showers instead of baths for 2 weeks or as directed by your health care provider.  Do not drive for 24 hours or as directed.  Do not drink alcohol while taking pain medicine.  Do not use tampons, douche, or have sexual intercourse for 2 weeks or until your health care provider says it is okay.  Take your temperature twice a day for 4-5 days. Write it down each time.  Follow your health care provider's advice about diet, exercise, and lifting.  If you develop constipation, you may:  Take a mild laxative if your health care provider approves.  Add bran foods to your diet.  Drink enough fluids to keep your urine clear or pale yellow.  Try to have someone with you or available to you for the first 24-48 hours, especially if you were given a general anesthetic.  Follow up with your health care provider as directed.   Call Dr. Deatra Ina [phone] if you have heavy bleeding, severe pain, or fevers.    This information is not intended to replace advice given to  you by your health care provider. Make sure you discuss any questions you have with your health care provider.   Document Released: 09/21/2013 Document Reviewed: 09/21/2013 Elsevier Interactive Patient Education 2016 [employer] INSTRUCTIONS: HYSTEROSCOPY / ENDOMETRIAL ABLATION The following instructions have been prepared to help you care for yourself upon your return home.  May Remove Scop patch on or before 11/26/15  May take Ibuprofen after 3:45pm  May take stool softner while taking narcotic pain medication to prevent constipation.  Drink plenty of water.  Personal hygiene:  Use sanitary pads for vaginal drainage, not tampons.  Shower the day after your procedure.  NO tub baths, pools or Jacuzzis for 2-3 weeks.  Wipe front to back after using the bathroom.  Activity and limitations:  Do NOT drive or operate any equipment for 24 hours. The effects of anesthesia are still present and drowsiness may result.  Do NOT rest in bed all day.  Walking is encouraged.  Walk up and down stairs slowly.  You may resume your normal activity in one to two days or as indicated by your physician. Sexual activity: NO intercourse for at least 2 weeks after the procedure, or as indicated by your Doctor.  Diet: Eat a light meal as desired this evening. You may resume your usual diet tomorrow.  Return to Work: You may resume your work activities in one to two days or as indicated by Marine scientist.  What to expect after your surgery: Expect to have vaginal bleeding/discharge  for 2-3 days and spotting for up to 10 days. It is not unusual to have soreness for up to 1-2 weeks. You may have a slight burning sensation when you urinate for the first day. Mild cramps may continue for a couple of days. You may have a regular period in 2-6 weeks.  Call your doctor for any of the following:  Excessive vaginal bleeding or clotting, saturating and changing one pad every hour.   Inability to urinate 6 hours after discharge from hospital.  Pain not relieved by pain medication.  Fever of 100.4 F or greater.  Unusual vaginal discharge or odor.  Return to office _________________Call for an appointment ___________________ Patients signature: ______________________ Nurses signature ________________________  [facility] [phone]

## 2015-11-23 NOTE — Anesthesia Postprocedure Evaluation (Signed)
Anesthesia Post Note  Patient: HONORA GWILT  Procedure(s) Performed: Procedure(s) (LRB): HYSTEROSCOPY WITH NOVASURE AND ROOLER BAR (N/A)  Patient location during evaluation: PACU Anesthesia Type: General Level of consciousness: awake and alert Pain management: pain level controlled Vital Signs Assessment: post-procedure vital signs reviewed and stable Respiratory status: spontaneous breathing, nonlabored ventilation and respiratory function stable Cardiovascular status: blood pressure returned to baseline and stable Postop Assessment: no signs of nausea or vomiting Anesthetic complications: no    Last Vitals:  Filed Vitals:   11/23/15 1100 11/23/15 1115  BP: 119/89 118/84  Pulse: 61 63  Temp:  36.4 C  Resp: 16 16    Last Pain:  Filed Vitals:   11/23/15 1126  PainSc: 1                  Tennelle Taflinger A.

## 2015-11-23 NOTE — Anesthesia Preprocedure Evaluation (Signed)
Anesthesia Evaluation  Patient identified by MRN, date of birth, ID band Patient awake    Reviewed: Allergy & Precautions, NPO status , Patient's Chart, lab work & pertinent test results  History of Anesthesia Complications Negative for: history of anesthetic complications  Airway Mallampati: II  TM Distance: >3 FB Neck ROM: Full    Dental no notable dental hx. (+) Dental Advisory Given   Pulmonary neg pulmonary ROS,    Pulmonary exam normal breath sounds clear to auscultation       Cardiovascular hypertension, Pt. on medications Normal cardiovascular exam Rhythm:Regular Rate:Normal  Last cardiology note reviewed, normal cath and echo   Neuro/Psych  Headaches, PSYCHIATRIC DISORDERS Anxiety Depression    GI/Hepatic Neg liver ROS, GERD  ,  Endo/Other  Hypothyroidism   Renal/GU negative Renal ROS  negative genitourinary   Musculoskeletal negative musculoskeletal ROS (+)   Abdominal   Peds negative pediatric ROS (+)  Hematology negative hematology ROS (+)   Anesthesia Other Findings   Reproductive/Obstetrics negative OB ROS                             Anesthesia Physical Anesthesia Plan  ASA: II  Anesthesia Plan: General   Post-op Pain Management:    Induction: Intravenous  Airway Management Planned: LMA  Additional Equipment:   Intra-op Plan:   Post-operative Plan: Extubation in OR  Informed Consent: I have reviewed the patients History and Physical, chart, labs and discussed the procedure including the risks, benefits and alternatives for the proposed anesthesia with the patient or authorized representative who has indicated his/her understanding and acceptance.   Dental advisory given  Plan Discussed with: CRNA  Anesthesia Plan Comments:         Anesthesia Quick Evaluation

## 2015-11-23 NOTE — Transfer of Care (Signed)
Immediate Anesthesia Transfer of Care Note  Patient: Shelley Zimmerman  Procedure(s) Performed: Procedure(s): HYSTEROSCOPY WITH NOVASURE AND ROOLER BAR (N/A)  Patient Location: PACU  Anesthesia Type:General  Level of Consciousness: awake, alert  and oriented  Airway & Oxygen Therapy: Patient Spontanous Breathing and Patient connected to nasal cannula oxygen  Post-op Assessment: Report given to RN and Post -op Vital signs reviewed and stable  Post vital signs: Reviewed and stable  Last Vitals:  Filed Vitals:   11/23/15 0754  BP: 120/87  Pulse: 68  Temp: 36.7 C  Resp: 18    Complications: No apparent anesthesia complications

## 2015-11-23 NOTE — Progress Notes (Signed)
I have interviewed and performed the pertinent exams on my patient to confirm that there have been no significant changes in her condition since the dictation of her history and physical exam.  

## 2015-11-26 ENCOUNTER — Encounter (HOSPITAL_COMMUNITY): Payer: Self-pay | Admitting: Obstetrics & Gynecology

## 2015-11-30 ENCOUNTER — Ambulatory Visit (INDEPENDENT_AMBULATORY_CARE_PROVIDER_SITE_OTHER): Payer: BLUE CROSS/BLUE SHIELD | Admitting: Neurology

## 2015-11-30 ENCOUNTER — Encounter: Payer: Self-pay | Admitting: Neurology

## 2015-11-30 VITALS — BP 116/78 | HR 83 | Ht 62.0 in | Wt 171.0 lb

## 2015-11-30 DIAGNOSIS — G43009 Migraine without aura, not intractable, without status migrainosus: Secondary | ICD-10-CM

## 2015-11-30 DIAGNOSIS — R42 Dizziness and giddiness: Secondary | ICD-10-CM | POA: Diagnosis not present

## 2015-11-30 DIAGNOSIS — M542 Cervicalgia: Secondary | ICD-10-CM | POA: Diagnosis not present

## 2015-11-30 MED ORDER — ZOLMITRIPTAN 5 MG NA SOLN: 1.0000 | NASAL | Status: DC | PRN

## 2015-11-30 MED ORDER — ZOLMITRIPTAN 5 MG NA SOLN: NASAL | Status: DC

## 2015-11-30 MED ORDER — ONDANSETRON HCL 4 MG PO TABS: 4.0000 mg | ORAL_TABLET | Freq: Three times a day (TID) | ORAL | Status: DC | PRN

## 2015-11-30 NOTE — Progress Notes (Signed)
NEUROLOGY CONSULTATION NOTE  Shelley Zimmerman MRN: [id number] DOB: Aug 22, 1966  Referring provider: Dr. Chase Caller Primary care provider: Dr. Maudie Mercury  Reason for consult:  dizziness  HISTORY OF PRESENT ILLNESS: Shelley Zimmerman is a 49 year old right-handed female with hypertension, GERD, migraines and history of cardiac cath and kidney stones who presents for dizziness.  History obtained by patient and cardiology and pulmonology notes. Labs reviewed.  Since 2011, she has experienced dyspnea on exertion.  Since the summer, it has gotten worse.  She will feel out of breath if she simply takes to somebody.  Her lips, toes and fingertips turn blue.  Since the summer, she also has been experiencing dizzy spells.  It often happens when she lifts her head up after reading with her head down for a prolonged period of time.  It also usually occurs after exertional activity.  When she recently had a stress test, she got off of the bike and when she looked down, she felt dizzy.  Her oxygen saturation reportedly dropped to 90%.  She was evaluated for pulmonary hypertension with CT of chest and cardiac cath, which was negative.  The dizziness is described as a sense of movement but not clear spinning.  There is a slight lightheaded component as well.  She notes blurred vision but denies double vision.  She has longstanding history of migraines which resolved after the birth of her son.    Over the past year, the headaches recurred.  She wakes up with them.  They start in the back of her head and it radiates up to the front.  She does note neck tightness.  She occasionally has burning tingling down her arms.  It is associated with photophobia and severe nausea.  She must lay still because any slight movement will cause her to vomit.  It lasts about 3 to 4 hours and occurs about twice a month.  She does snore and had been checked for OSA several years ago, which was negative.  She denies daytime somnolence.    The  dyspnea has been determined by pulmonology to be likely due to obesity and diastolic dysfunction.  The dizziness is not suspected to be due to the dyspnea.    Autoimmune labs (ANA, dsDNA, Scleroderma, Sjogrens) are unremarkable.  PAST MEDICAL HISTORY: Past Medical History  Diagnosis Date  . Hypertension   . S/P cardiac cath 2011  . Kidney stones   . GERD (gastroesophageal reflux disease)   . Migraine   . Hypothyroidism     has resolved   . Depression   . Anxiety   . Vaginal delivery 1996    PAST SURGICAL HISTORY: Past Surgical History  Procedure Laterality Date  . Lithotripsy  2007  . Cardiac catheterization N/A 11/01/2015    Procedure: Right Heart Cath;  Surgeon: Larey Dresser, MD;  Location: [facility];  Service: Cardiovascular;  Laterality: N/A;  . Ureteroscopy with holmium laser lithotripsy    . Dilation and curettage of uterus  1995, 1996, 1996  . Hysteroscopy with novasure N/A 11/23/2015    Procedure: HYSTEROSCOPY WITH NOVASURE AND Emi Holes;  Surgeon: Alden Hipp, MD;  Location: [facility];  Service: Gynecology;  Laterality: N/A;    MEDICATIONS: Current Outpatient Prescriptions on File Prior to Visit  Medication Sig Dispense Refill  . clonazePAM (KLONOPIN) 0.5 MG tablet Take 0.5 mg by mouth at bedtime as needed (For sleep.).     Marland Kitchen Multiple Vitamin (MULTIVITAMIN) capsule Take 1 capsule by mouth  daily.    . olmesartan (BENICAR) 40 MG tablet Take 40 mg by mouth daily.    . valACYclovir (VALTREX) 1000 MG tablet Take 1,000 mg by mouth 2 (two) times daily as needed.     No current facility-administered medications on file prior to visit.    ALLERGIES: Allergies  Allergen Reactions  . Diovan [Valsartan] Swelling    Facial swelling    FAMILY HISTORY: Family History  Problem Relation Age of Onset  . Diabetes Mellitus II Father   . Hypertension Father   . Hypothyroidism Sister     SOCIAL HISTORY: Social History   Social History  . Marital Status:  Married    Spouse Name: N/A  . Number of Children: N/A  . Years of Education: N/A   Occupational History  . office manager    Social History Main Topics  . Smoking status: Never Smoker   . Smokeless tobacco: Never Used  . Alcohol Use: 0.0 oz/week    0 Standard drinks or equivalent per week     Comment: occassional  . Drug Use: No  . Sexual Activity: Not on file   Other Topics Concern  . Not on file   Social History Narrative    REVIEW OF SYSTEMS: Constitutional: No fevers, chills, or sweats, no generalized fatigue, change in appetite Eyes: No visual changes, double vision, eye pain Ear, nose and throat: No hearing loss, ear pain, nasal congestion, sore throat Cardiovascular: No chest pain, palpitations Respiratory:  No shortness of breath at rest or with exertion, wheezes GastrointestinaI: No nausea, vomiting, diarrhea, abdominal pain, fecal incontinence Genitourinary:  No dysuria, urinary retention or frequency Musculoskeletal:  No neck pain, back pain Integumentary: No rash, pruritus, skin lesions Neurological: as above Psychiatric: No depression, insomnia, anxiety Endocrine: No palpitations, fatigue, diaphoresis, mood swings, change in appetite, change in weight, increased thirst Hematologic/Lymphatic:  No anemia, purpura, petechiae. Allergic/Immunologic: no itchy/runny eyes, nasal congestion, recent allergic reactions, rashes  PHYSICAL EXAM: Filed Vitals:   11/30/15 1422  BP: 116/78  Pulse: 83   General: No acute distress.  Patient appears well-groomed.  Head:  Normocephalic/atraumatic Eyes:  fundi unremarkable, without vessel changes, exudates, hemorrhages or papilledema. Neck: supple, no paraspinal tenderness, full range of motion Back: No paraspinal tenderness Heart: regular rate and rhythm Lungs: Clear to auscultation bilaterally. Vascular: No carotid bruits. Neurological Exam: Mental status: alert and oriented to person, place, and time, recent and  remote memory intact, fund of knowledge intact, attention and concentration intact, speech fluent and not dysarthric, language intact. Cranial nerves: CN I: not tested CN II: pupils equal, round and reactive to light, visual fields intact, fundi unremarkable, without vessel changes, exudates, hemorrhages or papilledema. CN III, IV, VI:  full range of motion, no nystagmus, no ptosis CN V: facial sensation intact CN VII: upper and lower face symmetric CN VIII: hearing intact CN IX, X: gag intact, uvula midline CN XI: sternocleidomastoid and trapezius muscles intact CN XII: tongue midline Bulk & Tone: normal, no fasciculations. Motor:  5/5 throughout Sensation: temperature and vibration sensation intact. Deep Tendon Reflexes:  2+ throughout, toes downgoing.  Finger to nose testing:  Without dysmetria.  Heel to shin:  Without dysmetria.  Gait:  Normal station and stride.  Able to turn and tandem walk. Romberg negative.  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  Will refer to PT for therapy on the neck, as well as vestibular rehab.   2.  For abortive therapy, when  she wakes up with migraine, she can take Zomig 5mg  NS and Zofran 4mg  3.  If PT ineffective for either diagnosis, consider MRI of brain and/or cervical spine or start preventative medication. 4.  Follow up in 3 months  Thank you for allowing me to take part in the care of this patient.  Metta Clines, DO  CC:  [name], MD  Brand Males, MD

## 2015-11-30 NOTE — Patient Instructions (Signed)
1.  If you wake up with a headache, take Zomig 5mg  spray once in one nostril.  May repeat once in 2 hours if needed.  Do not exceed 2 sprays in 24 hours.  If still nauseous, may take Zofran 4mg  every 8 hours as needed. 2.  Refer for physical therapy for neck pain/cervicogenic migraine and vestibular rehabilitation 3.  Follow up in 3 months, but call in 4 weeks with update.

## 2015-11-30 NOTE — Progress Notes (Signed)
Chart forwarded.  

## 2015-12-03 ENCOUNTER — Ambulatory Visit (HOSPITAL_COMMUNITY)
Admission: RE | Admit: 2015-12-03 | Discharge: 2015-12-03 | Disposition: A | Discharge status: Home / Self Care | Payer: BLUE CROSS/BLUE SHIELD | Source: Ambulatory Visit | Attending: Internal Medicine | Admitting: Internal Medicine

## 2015-12-03 ENCOUNTER — Other Ambulatory Visit (HOSPITAL_COMMUNITY): Payer: Self-pay | Admitting: Internal Medicine

## 2015-12-03 DIAGNOSIS — R202 Paresthesia of skin: Secondary | ICD-10-CM

## 2015-12-03 DIAGNOSIS — R200 Anesthesia of skin: Secondary | ICD-10-CM

## 2015-12-03 MED ORDER — GADOBENATE DIMEGLUMINE 529 MG/ML IV SOLN
15.0000 mL | Freq: Once | INTRAVENOUS | Status: AC | PRN
  Administered 2015-12-03: 15 mL via INTRAVENOUS

## 2015-12-06 ENCOUNTER — Telehealth: Payer: Self-pay | Admitting: Cardiovascular Disease

## 2015-12-06 NOTE — Telephone Encounter (Signed)
12/06/2015 Received faxed referral packet from [facility] for upcoming appointment with Dr. Claiborne Billings on 12/18/2015.  Records given to [facility]. cbr

## 2015-12-11 ENCOUNTER — Ambulatory Visit: Payer: BLUE CROSS/BLUE SHIELD | Admitting: Neurology

## 2015-12-18 ENCOUNTER — Encounter: Payer: Self-pay | Admitting: Cardiovascular Disease

## 2015-12-18 ENCOUNTER — Ambulatory Visit (INDEPENDENT_AMBULATORY_CARE_PROVIDER_SITE_OTHER): Payer: BLUE CROSS/BLUE SHIELD | Admitting: Cardiovascular Disease

## 2015-12-18 VITALS — BP 120/82 | HR 83 | Ht 62.0 in | Wt 169.5 lb

## 2015-12-18 DIAGNOSIS — Z8669 Personal history of other diseases of the nervous system and sense organs: Secondary | ICD-10-CM | POA: Diagnosis not present

## 2015-12-18 DIAGNOSIS — I10 Essential (primary) hypertension: Secondary | ICD-10-CM

## 2015-12-18 DIAGNOSIS — E785 Hyperlipidemia, unspecified: Secondary | ICD-10-CM | POA: Diagnosis not present

## 2015-12-18 DIAGNOSIS — R002 Palpitations: Secondary | ICD-10-CM

## 2015-12-18 LAB — CBC
HCT: 38.5 % (ref 36.0–46.0)
HEMOGLOBIN: 13.1 g/dL (ref 12.0–15.0)
MCH: 30.9 pg (ref 26.0–34.0)
MCHC: 34 g/dL (ref 30.0–36.0)
MCV: 90.8 fL (ref 78.0–100.0)
MPV: 10.2 fL (ref 8.6–12.4)
Platelets: 235 10*3/uL (ref 150–400)
RBC: 4.24 MIL/uL (ref 3.87–5.11)
RDW: 13.9 % (ref 11.5–15.5)
WBC: 5.7 10*3/uL (ref 4.0–10.5)

## 2015-12-18 LAB — COMPREHENSIVE METABOLIC PANEL
ALBUMIN: 4.3 g/dL (ref 3.6–5.1)
ALK PHOS: 42 U/L (ref 33–115)
ALT: 16 U/L (ref 6–29)
AST: 13 U/L (ref 10–35)
BILIRUBIN TOTAL: 0.6 mg/dL (ref 0.2–1.2)
BUN: 11 mg/dL (ref 7–25)
CALCIUM: 9.5 mg/dL (ref 8.6–10.2)
CO2: 26 mmol/L (ref 20–31)
CREATININE: 0.71 mg/dL (ref 0.50–1.10)
Chloride: 105 mmol/L (ref 98–110)
Glucose, Bld: 83 mg/dL (ref 65–99)
Potassium: 4.4 mmol/L (ref 3.5–5.3)
SODIUM: 140 mmol/L (ref 135–146)
TOTAL PROTEIN: 6.4 g/dL (ref 6.1–8.1)

## 2015-12-18 LAB — TSH: TSH: 1.162 u[IU]/mL (ref 0.350–4.500)

## 2015-12-18 NOTE — Patient Instructions (Addendum)
Your physician has recommended that you wear an event monitor. Event monitors are medical devices that record the heart's electrical activity. Doctors most often [place] these monitors to diagnose arrhythmias. Arrhythmias are problems with the speed or rhythm of the heartbeat. The monitor is a small, portable device. You can wear one while you do your normal daily activities. This is usually used to diagnose what is causing palpitations/syncope (passing out). This will be placed on at the [address] office and will be worn for 2 weeks.  Your physician recommends that you return for lab work.   Your physician recommends that you schedule a follow-up appointment in: 6 weeks with Dr Claiborne Billings.

## 2015-12-19 ENCOUNTER — Ambulatory Visit (INDEPENDENT_AMBULATORY_CARE_PROVIDER_SITE_OTHER): Payer: BLUE CROSS/BLUE SHIELD

## 2015-12-19 DIAGNOSIS — R002 Palpitations: Secondary | ICD-10-CM

## 2015-12-20 ENCOUNTER — Encounter: Payer: Self-pay | Admitting: *Deleted

## 2015-12-20 ENCOUNTER — Ambulatory Visit: Payer: BLUE CROSS/BLUE SHIELD | Admitting: Neurology

## 2015-12-20 ENCOUNTER — Telehealth: Payer: Self-pay | Admitting: *Deleted

## 2015-12-20 NOTE — Telephone Encounter (Signed)
no showed new patient appt 

## 2015-12-21 LAB — VITAMIN D 1,25 DIHYDROXY
VITAMIN D 1, 25 (OH) TOTAL: 71 pg/mL (ref 18–72)
VITAMIN D3 1, 25 (OH): 71 pg/mL
Vitamin D2 1, 25 (OH)2: <8 pg/mL

## 2015-12-23 ENCOUNTER — Encounter: Payer: Self-pay | Admitting: Cardiovascular Disease

## 2015-12-23 DIAGNOSIS — I10 Essential (primary) hypertension: Secondary | ICD-10-CM | POA: Insufficient documentation

## 2015-12-23 DIAGNOSIS — Z8669 Personal history of other diseases of the nervous system and sense organs: Secondary | ICD-10-CM | POA: Insufficient documentation

## 2015-12-23 DIAGNOSIS — R002 Palpitations: Secondary | ICD-10-CM | POA: Insufficient documentation

## 2015-12-23 DIAGNOSIS — E785 Hyperlipidemia, unspecified: Secondary | ICD-10-CM | POA: Insufficient documentation

## 2015-12-23 NOTE — Progress Notes (Signed)
Patient ID: Shelley Zimmerman, female   DOB: 1966/07/03, 50 y.o.   MRN: [id number]     Primary MD: [name], MD  PATIENT PROFILE: Shelley Zimmerman is a 50 y.o. female who is referred by Dr. [name] for evaluation of labile hypertension as well as intermittent arm tingling.   HPI:  Shelley Zimmerman has a history of hypertension, documented since 2011.  In 2011 she had experienced some vague chest pain.  A stress test was reportedly borderline abnormal, suggestive of possible distal anterior ischemia.  She has a history of obesity.  Due to recurrent chest pain she ultimately underwent cardiac catheterization in October 2011 by Dr. Ellyn Hack, which revealed normal coronary arteries.  With her hypertensive history distal aortography  was also performed and she had widely patent renal arteries without evidence for renal artery stenosis.  The patient has a history of migraine headaches as well as nephrolithiasis.  Over the last several months, she has had issues with dizziness when she looks up or down.  An MRI of her head apparently was normal.  She recently has been having episodes of tingling in her arms without chest pain which at times wakes her up at night.  She does admit to some neck discomfort.  She admits to some visual issues and when she feels this her blood pressure comes labile and her heart rate is fast.  She denies syncope.  She denies any flushing.  She denies any orthostatic symptomatology.  She recently had seen a neurologist and according to the patient, he was more concerned with her history of migraine headaches than her current symptoms.  She tells me she will be seeing another neurology group in the imminent future.  In September she underwent a CT of her chest which did not reveal interstitial lung disease but showed mild air trapping indicative of mild small airway disease.  On 10/25/2015 a 2-D echo Doppler study showed an ejection fraction at 65-70% without wall motion abnormalities.   There was grade 1 diastolic dysfunction.  There was slight increased echogenicity on the ventricular surface of the distal left ventricular outflow tract was felt that this most likely represented lateral contours of the out flow tract and no further workup was indicated although a TEE would be helpful for further clarification.  On 11/01/2015.  She underwent a right heart catheterization procedure by Dr. Algernon Huxley.  Right heart pressures were low, suggestive of dehydration.  RA mean pressure of 3, RV pressure 18 over 1, PA pressure 17 over 3 with a mean of 9 and a mean wedge pressure of 3.  Cardiac output was 3.4 and 3.6 with an index of 1.9 and 2.1 by the Fick and thermodilution methods.  Past Medical History  Diagnosis Date  . Hypertension   . S/P cardiac cath 2011  . Kidney stones   . GERD (gastroesophageal reflux disease)   . Migraine   . Hypothyroidism     has resolved   . Depression   . Anxiety   . Vaginal delivery 1996    Past Surgical History  Procedure Laterality Date  . Lithotripsy  2007  . Cardiac catheterization N/A 11/01/2015    Procedure: Right Heart Cath;  Surgeon: Larey Dresser, MD;  Location: [facility];  Service: Cardiovascular;  Laterality: N/A;  . Ureteroscopy with holmium laser lithotripsy    . Dilation and curettage of uterus  1995, 1996, 1996  . Hysteroscopy with novasure N/A 11/23/2015    Procedure:  HYSTEROSCOPY WITH NOVASURE AND Emi Holes;  Surgeon: Alden Hipp, MD;  Location: [facility];  Service: Gynecology;  Laterality: N/A;    Allergies  Allergen Reactions  . Diovan [Valsartan] Swelling    Facial swelling  . Wellbutrin [Bupropion]     agitation  . Zoloft [Sertraline Hcl]     tingling    Current Outpatient Prescriptions  Medication Sig Dispense Refill  . clonazePAM (KLONOPIN) 0.5 MG tablet Take 0.5 mg by mouth at bedtime as needed (For sleep.).     Marland Kitchen ibuprofen (ADVIL,MOTRIN) 200 MG tablet Take 200 mg by mouth every 6 (six) hours as needed  (every other day).    . Multiple Vitamin (MULTIVITAMIN) capsule Take 1 capsule by mouth daily.    Marland Kitchen olmesartan (BENICAR) 40 MG tablet Take 40 mg by mouth daily.    . ondansetron (ZOFRAN) 4 MG tablet Take 1 tablet (4 mg total) by mouth every 8 (eight) hours as needed for nausea or vomiting. 20 tablet 0  . valACYclovir (VALTREX) 1000 MG tablet Take 1,000 mg by mouth 2 (two) times daily as needed.    . zolmitriptan (ZOMIG) 5 MG nasal solution 1 spray in one nostril.  May repeat once in 2 hours if needed.  Do not exceed 2 sprays in 24 hours. 6 Units 0  . pravastatin (PRAVACHOL) 20 MG tablet Reported on 12/18/2015  0  . sertraline (ZOLOFT) 50 MG tablet Reported on 12/18/2015  0   No current facility-administered medications for this visit.    Social History   Social History  . Marital Status: Married    Spouse Name: N/A  . Number of Children: N/A  . Years of Education: N/A   Occupational History  . office manager    Social History Main Topics  . Smoking status: Never Smoker   . Smokeless tobacco: Never Used  . Alcohol Use: 0.0 oz/week    0 Standard drinks or equivalent per week     Comment: occassional  . Drug Use: No  . Sexual Activity: Not on file   Other Topics Concern  . Not on file   Social History Narrative   Lives   Caffeine use:    Additional social history is notable that she is married for 23 years.  She has one child, a son who is age 28.  There is no history of tobacco use.  She rarely drinks alcohol, which typically is beer or wine.  She does not routinely exercise but she just joined a gym and plans to initiate an exercise program.  Family History  Problem Relation Age of Onset  . Diabetes Mellitus II Father   . Hypertension Father   . Hypothyroidism Sister   . Cardiomyopathy Mother   . Lung cancer Paternal Grandfather   . Diabetes Sister   . Thyroid disease Sister    Additional family history is notable in that her mother died at age 39 suddenly.  Her father  is living and has a history of hypertension and is diabetic.  She has one brother age 11 with thyroid abnormality and 2 sisters, ages 23 and 45, 1 who has thyroid abnormalities and is diabetic.  ROS General: Negative; No fevers, chills, or night sweats HEENT: Negative; No changes in vision or hearing, sinus congestion, difficulty swallowing Pulmonary: Negative; No cough, wheezing, shortness of breath, hemoptysis Cardiovascular:  See HPI;  GI: Negative; No nausea, vomiting, diarrhea, or abdominal pain GU: Negative; No dysuria, hematuria, or difficulty voiding Musculoskeletal: Negative; no myalgias, joint  pain, or weakness Hematologic/Oncologic: Negative; no easy bruising, bleeding Endocrine: Negative; no heat/cold intolerance; no diabetes Neuro: History of migraine headaches Skin: Negative; No rashes or skin lesions Psychiatric: Negative; No behavioral problems, depression Sleep: Negative; No daytime sleepiness, hypersomnolence, bruxism, restless legs, hypnogagnic hallucinations Other comprehensive 14 point system review is negative   Physical Exam BP 120/82 mmHg  Pulse 83  Ht _0  (1.575 m)  Wt 169 lb 8 oz (76.885 kg)  BMI 30.99 kg/m2  LMP 11/02/2015 (Approximate)  Repeat blood pressure by me was 112/72, supine and 120/78 standing.  Wt Readings from Last 3 Encounters:  12/18/15 169 lb 8 oz (76.885 kg)  11/30/15 171 lb (77.565 kg)  11/21/15 172 lb 4 oz (78.132 kg)   General: Alert, oriented, no distress.  Skin: normal turgor, no rashes, warm and dry HEENT: Normocephalic, atraumatic. Pupils equal round and reactive to light; sclera anicteric; extraocular muscles intact; Fundi without hemorrhages or exudates Nose without nasal septal hypertrophy Mouth/Parynx benign; Mallinpatti scale 3 Neck: No JVD, no carotid bruits; normal carotid upstroke Lungs: clear to ausculatation and percussion; no wheezing or rales Chest wall: without tenderness to palpitation Heart: PMI not  displaced, RRR, s1 s2 normal, 1/6 systolic murmur, no diastolic murmur, no rubs, gallops, thrills, or heaves Abdomen: soft, nontender; no hepatosplenomehaly, BS+; abdominal aorta nontender and not dilated by palpation. Back: no CVA tenderness Pulses 2+ Musculoskeletal: full range of motion, normal strength, no joint deformities Extremities: no clubbing cyanosis or edema, Homan's sign negative  Neurologic: grossly nonfocal; Cranial nerves grossly wnl Psychologic: Normal mood and affect   ECG (independently read by me): Normal sinus rhythm at 83 bpm.  No ectopy.  Normal intervals.  LABS:  BMP Latest Ref Rng 12/18/2015 11/01/2015 10/12/2015  Glucose 65 - 99 mg/dL 83 102(H) 104(H)  BUN 7 - 25 mg/dL _1 Creatinine 0.50 - 1.10 mg/dL 0.71 0.73 0.71  Sodium 135 - 146 mmol/L 140 137 133(L)  Potassium 3.5 - 5.3 mmol/L 4.4 4.1 4.2  Chloride 98 - 110 mmol/L 105 106 103  CO2 20 - 31 mmol/L _2 Calcium 8.6 - 10.2 mg/dL 9.5 9.3 8.8(L)     Hepatic Function Latest Ref Rng 12/18/2015  Total Protein 6.1 - 8.1 g/dL 6.4  Albumin 3.6 - 5.1 g/dL 4.3  AST 10 - 35 U/L 13  ALT 6 - 29 U/L 16  Alk Phosphatase 33 - 115 U/L 42  Total Bilirubin 0.2 - 1.2 mg/dL 0.6    CBC Latest Ref Rng 12/18/2015 11/01/2015 10/12/2015  WBC 4.0 - 10.5 K/uL 5.7 4.5 4.9  Hemoglobin 12.0 - 15.0 g/dL 13.1 13.6 13.2  Hematocrit 36.0 - 46.0 % 38.5 39.8 39.1  Platelets 150 - 400 K/uL 235 224 220   Lab Results  Component Value Date   MCV 90.8 12/18/2015   MCV 91.7 11/01/2015   MCV 91.8 10/12/2015   Lab Results  Component Value Date   TSH 1.162 12/18/2015   No results found for: HGBA1C   BNP    Component Value Date/Time   BNP 6.7 10/12/2015 1001    ProBNP No results found for: PROBNP   Lipid Panel  No results found for: CHOL, TRIG, HDL, CHOLHDL, VLDL, LDLCALC, LDLDIRECT  RADIOLOGY: Mr Kizzie Fantasia Contrast  12/03/2015  CLINICAL DATA:  50 year old female with numbness and tingling in both upper  extremities which began over the last 3 days. Visual changes. Initial encounter. EXAM: MRI HEAD WITHOUT AND WITH CONTRAST TECHNIQUE: Multiplanar, multiecho  pulse sequences of the brain and surrounding structures were obtained without and with intravenous contrast. CONTRAST:  28m MULTIHANCE GADOBENATE DIMEGLUMINE 529 MG/ML IV SOLN COMPARISON:  None. FINDINGS: Cerebral volume is normal. No restricted diffusion to suggest acute infarction. No midline shift, mass effect, evidence of mass lesion, ventriculomegaly, extra-axial collection or acute intracranial hemorrhage. Cervicomedullary junction and pituitary are within normal limits. Negative visualized cervical spine. Major intracranial vascular flow voids are preserved, there is mild intracranial artery tortuosity, more so in the posterior fossa. GPearline Cablesand white matter signal appears essentially normal for age. There is a tiny nonspecific focus of T2 hyperintensity in the right inferior cerebellum on series 6, image 5, which most resembles a tiny chronic lacune. No chronic cerebral blood products. No abnormal enhancement identified. Optic chiasm and cavernous sinuses appear normal. No orbit soft tissue abnormality identified. Mastoids are clear. Paranasal sinuses are clear. Negative scalp soft tissues. Normal bone marrow signal. IMPRESSION: No acute intracranial abnormality and essentially normal for age MRI appearance of the brain. Electronically Signed   By: HGenevie AnnM.D.   On: 12/03/2015 17:00     ASSESSMENT AND PLAN: Ms.  Shelley Zimmerman a 50year old female who has a five-year history of hypertension in the past has had episodes of somewhat atypical chest pain.  A cardiac catheterization in 2011 demonstrated normal coronary arteries, and at that time she had widely patent renal arteries noted on distal aortography.  She has a history of migraine headaches.  She recently has had some neck discomfort and also admits to arm tingling particular during the  night.  Has been associated with a sensation of increased blood pressure and heart rate.  She recently had had a right heart catheterization which revealed that she most likely was dehydrated and had a very low PCWP.  On exam today, she is not orthostatic and her blood pressure is well controlled on her current therapy consisting of Benicar 40 mg daily.  Reportedly, there is an allergy to Diovan.  I have recommended that she undergo a cardiac monitor to assess her heart rhythm and some of these episodes seem to occur in the setting of an increased heart rate.  In 2011 .  She had normal renal arteries arguing against a renal vascular etiology to her hypertension.  I am concerned that she may have degenerative cervical disc disease, which may be contributing to some of her arm tingling and neck discomfort.  I understand she will be following up with another neurology group.  I would ask that they consider evaluation for cervical degenerative disease.  I have asked the patient to continue to monitor her blood pressure.  I had not seen the referral papers from  Dr. KMaudie Mercuryuntil the patient had left the office regarding his request for evaluation for secondary hypertension.  At present, I do not believe the patient has secondary hypertension but if marked blood pressure lability continues an abdominal CT scan to assess her adrenal gland, 24 urine studies to assess for possible pheochromocytoma or carcinoid and a follow-up renal duplex scan may be indicated.  I have recommended that the patient have fasting laboratory.  She had recently been started on pravastatin for hyperlipidemia.  I will see her back in the office in 4-6 weeks for reevaluation.   TTroy Sine MD, FB[facility]1/07/2016 11:42 AM

## 2015-12-26 ENCOUNTER — Encounter: Payer: Self-pay | Admitting: *Deleted

## 2015-12-27 ENCOUNTER — Ambulatory Visit (INDEPENDENT_AMBULATORY_CARE_PROVIDER_SITE_OTHER): Payer: BLUE CROSS/BLUE SHIELD | Admitting: Neurology

## 2015-12-27 ENCOUNTER — Encounter: Payer: Self-pay | Admitting: Neurology

## 2015-12-27 VITALS — BP 122/80 | HR 78 | Ht 62.0 in | Wt 173.6 lb

## 2015-12-27 DIAGNOSIS — R200 Anesthesia of skin: Secondary | ICD-10-CM | POA: Insufficient documentation

## 2015-12-27 DIAGNOSIS — R42 Dizziness and giddiness: Secondary | ICD-10-CM | POA: Diagnosis not present

## 2015-12-27 DIAGNOSIS — R202 Paresthesia of skin: Secondary | ICD-10-CM | POA: Diagnosis not present

## 2015-12-27 DIAGNOSIS — H539 Unspecified visual disturbance: Secondary | ICD-10-CM | POA: Diagnosis not present

## 2015-12-27 NOTE — Progress Notes (Signed)
[facility]    Provider:  Dr Jaynee Eagles Referring Provider: [name], MD Primary Care Physician:  [name], MD  CC:  Left-sided sensory symptoms  HPI:  Shelley Zimmerman is a 50 y.o. female here as a referral from Dr. Maudie Mercury for left-sided sensory symptoms. PMHx of HTN, catheterization, migraine. Started the 4th or 5th of December. She started taking Zoloft a few days before and she also had heart cath and ablation. The pain starts on the top of her head to the base of the neck down both arms like heat. Then tingling and numbness on the left side of the arm. The tingling radiates down the medial arm to the 5th digit and down the leg as well. She has numbness on the left arm and left leg, the muscle feels rigid. She would wake up with a pounding heart and her blood pressure was spiking up to 150-160/130 and would be associated with the symptoms. Left side of the face tingling. Would continue for several hours, at least 3. Vision blurry. Frequency increased from just at night to multiple times a day. 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 It all started after a heart catheterization and ablation and around the time she started taking zoloft with a lot of stress. Vision has continuously been blurry.   Reviewed notes, labs and imaging from outside physicians, which showed:  Mri of the brain 12/03/2015: personally reviewed and agree with the following.  Cerebral volume is normal. No restricted diffusion to suggest acute infarction. No midline shift, mass effect, evidence of mass lesion, ventriculomegaly, extra-axial collection or acute intracranial hemorrhage. Cervicomedullary junction and pituitary are within normal limits. Negative visualized cervical spine. Major intracranial vascular flow voids are preserved, there is mild intracranial artery tortuosity, more so in the posterior fossa.Pearline Cables and white matter signal appears essentially normal for age. There is a tiny nonspecific  focus of T2 hyperintensity in the right inferior cerebellum on series 6, image 5, which most resembles a tiny chronic lacune. No chronic cerebral blood products. No abnormal enhancement identified. Optic chiasm and cavernous sinuses appear normal. No orbit soft tissue abnormality identified. Mastoids are clear. Paranasal sinuses are clear. Negative scalp soft tissues. Normal bone marrow signal.  CBC, CMP and TSH normal  IMPRESSION: No acute intracranial abnormality and essentially normal for age MRI appearance of the brain.  Review of Systems: Patient complains of symptoms per HPI as well as the following symptoms: weight gain, blurred vision, palpitations, SOB, snoring, increased thirst, joint pain, headache, numbness, weakness, insomnia, snoring, dizziness. Pertinent negatives per HPI. All others negative.   Social History   Social History  . Marital Status: Married    Spouse Name: [name]  . Number of Children: 1  . Years of Education: 16   Occupational History  . office manager    Social History Main Topics  . Smoking status: Never Smoker   . Smokeless tobacco: Never Used  . Alcohol Use: 0.0 oz/week    0 Standard drinks or equivalent per week     Comment: occassional  . Drug Use: No  . Sexual Activity: Not on file   Other Topics Concern  . Not on file   Social History Narrative   Lives with husband and son      Caffeine use:  1-2 cups coffee per day    Family History  Problem Relation Age of Onset  . Diabetes Mellitus II Father   . Hypertension Father   . Hypothyroidism  Sister   . Cardiomyopathy Mother   . Lung cancer Paternal Grandfather   . Diabetes Sister   . Thyroid disease Sister   . Stroke Neg Hx   . Seizures Neg Hx     Past Medical History  Diagnosis Date  . Hypertension   . S/P cardiac cath 2011  . Kidney stones   . GERD (gastroesophageal reflux disease)   . Migraine   . Hypothyroidism     has resolved   . Depression   . Anxiety   . Vaginal  delivery 1996    Past Surgical History  Procedure Laterality Date  . Lithotripsy  2007  . Cardiac catheterization N/A 11/01/2015    Procedure: Right Heart Cath;  Surgeon: Larey Dresser, MD;  Location: [facility];  Service: Cardiovascular;  Laterality: N/A;  . Ureteroscopy with holmium laser lithotripsy    . Dilation and curettage of uterus  1995, 1996, 1996  . Hysteroscopy with novasure N/A 11/23/2015    Procedure: HYSTEROSCOPY WITH NOVASURE AND Emi Holes;  Surgeon: Alden Hipp, MD;  Location: [facility];  Service: Gynecology;  Laterality: N/A;    Current Outpatient Prescriptions  Medication Sig Dispense Refill  . clonazePAM (KLONOPIN) 0.5 MG tablet Take 0.5 mg by mouth at bedtime as needed (For sleep.).     Marland Kitchen ibuprofen (ADVIL,MOTRIN) 200 MG tablet Take 200 mg by mouth every 6 (six) hours as needed (every other day).    . Multiple Vitamin (MULTIVITAMIN) capsule Take 1 capsule by mouth daily.    Marland Kitchen olmesartan (BENICAR) 40 MG tablet Take 40 mg by mouth daily.    . ondansetron (ZOFRAN) 4 MG tablet Take 1 tablet (4 mg total) by mouth every 8 (eight) hours as needed for nausea or vomiting. 20 tablet 0  . valACYclovir (VALTREX) 1000 MG tablet Take 1,000 mg by mouth 2 (two) times daily as needed.    . zolmitriptan (ZOMIG) 5 MG nasal solution 1 spray in one nostril.  May repeat once in 2 hours if needed.  Do not exceed 2 sprays in 24 hours. 6 Units 0   No current facility-administered medications for this visit.    Allergies as of 12/27/2015 - Review Complete 12/27/2015  Allergen Reaction Noted  . Diovan [valsartan] Swelling   . Wellbutrin [bupropion]  12/20/2015  . Zoloft [sertraline hcl]  12/20/2015    Vitals: BP 122/80 mmHg  Pulse 78  Ht 5\' 2"  (1.575 m)  Wt 173 lb 9.6 oz (78.744 kg)  BMI 31.74 kg/m2 Last Weight:  Wt Readings from Last 1 Encounters:  12/27/15 173 lb 9.6 oz (78.744 kg)   Last Height:   Ht Readings from Last 1 Encounters:  12/27/15 5\' 2"  (1.575 m)     Physical exam: Exam: Gen: NAD, conversant, well nourised, obese, well groomed                     CV: RRR, no MRG. No Carotid Bruits. No peripheral edema, warm, nontender Eyes: Conjunctivae clear without exudates or hemorrhage  Neuro: Detailed Neurologic Exam  Speech:    Speech is normal; fluent and spontaneous with normal comprehension.  Cognition:    The patient is oriented to person, place, and time;     recent and remote memory intact;     language fluent;     normal attention, concentration,     fund of knowledge Cranial Nerves:    The pupils are equal, round, and reactive to light. The fundi are flat.  Visual fields are full to finger confrontation. Extraocular movements are intact. Trigeminal sensation is intact and the muscles of mastication are normal. The face is symmetric. The palate elevates in the midline. Hearing intact. Voice is normal. Shoulder shrug is normal. The tongue has normal motion without fasciculations.   Coordination:    Normal finger to nose and heel to shin. Normal rapid alternating movements.   Gait:    Heel-toe and tandem gait are normal.   Motor Observation:    No asymmetry, no atrophy, and no involuntary movements noted. Tone:    Normal muscle tone.    Posture:    Posture is normal. normal erect    Strength:    Strength is V/V in the upper and lower limbs.      Sensation: intact to LT     Reflex Exam:  DTR's:    Deep tendon reflexes in the upper and lower extremities are normal bilaterally.   Toes:    The toes are downgoing bilaterally.   Clonus:    Clonus is absent.      Assessment/Plan:  50 year old female with episodic left-sided sensory symptoms. Mri of the brain normal. Neuro exam non-focal.  DDX includes sensory seizures, migraine aura, anxiety, lesion of the cervical cord, peripheral neuropathy   MRI of the cervical cord  Emg/ncs of the left arm and left leg. EEG to eval for sensory seizures labs   Sarina Ill,  MD  [facility] [address] [place], [place] [address]  Phone [phone] Fax [phone]

## 2015-12-27 NOTE — Patient Instructions (Signed)
Remember to drink plenty of fluid, eat healthy meals and do not skip any meals. Try to eat protein with a every meal and eat a healthy snack such as fruit or nuts in between meals. Try to keep a regular sleep-wake schedule and try to exercise daily, particularly in the form of walking, 20-30 minutes a day, if you can.   As far as diagnostic testing: MRI of the cervical cord, EEG, EMG/NCS  I would like to see you back for emg/ncs, sooner if we need to. Please call us with any interim questions, concerns, problems, updates or refill requests.   Our phone number is [phone]. We also have an after hours call service for urgent matters and there is a physician on-call for urgent questions. For any emergencies you know to call 911 or go to the nearest emergency room

## 2015-12-29 ENCOUNTER — Encounter: Payer: Self-pay | Admitting: Neurology

## 2016-01-01 ENCOUNTER — Telehealth: Payer: Self-pay | Admitting: Cardiovascular Disease

## 2016-01-01 ENCOUNTER — Telehealth: Payer: Self-pay | Admitting: *Deleted

## 2016-01-01 NOTE — Telephone Encounter (Signed)
New problem   Pt want you to extend the date for her heart monitor

## 2016-01-01 NOTE — Telephone Encounter (Signed)
Called to inform patient cardiac event monitor has been extended out to 30 days per her request.  The charges for a cardiac event monitor are the same for up to 30 days.    [employer] has been notified and will contact her to see if she needs additional supplies.

## 2016-01-02 ENCOUNTER — Ambulatory Visit (INDEPENDENT_AMBULATORY_CARE_PROVIDER_SITE_OTHER): Payer: BLUE CROSS/BLUE SHIELD

## 2016-01-02 DIAGNOSIS — R202 Paresthesia of skin: Secondary | ICD-10-CM | POA: Diagnosis not present

## 2016-01-02 DIAGNOSIS — H539 Unspecified visual disturbance: Secondary | ICD-10-CM

## 2016-01-02 DIAGNOSIS — R42 Dizziness and giddiness: Secondary | ICD-10-CM | POA: Diagnosis not present

## 2016-01-02 DIAGNOSIS — R200 Anesthesia of skin: Secondary | ICD-10-CM

## 2016-01-03 MED ORDER — GADOPENTETATE DIMEGLUMINE 469.01 MG/ML IV SOLN: 15.0000 mL | Freq: Once | INTRAVENOUS | Status: AC | PRN

## 2016-01-21 ENCOUNTER — Ambulatory Visit (INDEPENDENT_AMBULATORY_CARE_PROVIDER_SITE_OTHER): Payer: BLUE CROSS/BLUE SHIELD | Admitting: Neurology

## 2016-01-21 ENCOUNTER — Ambulatory Visit (INDEPENDENT_AMBULATORY_CARE_PROVIDER_SITE_OTHER): Payer: Self-pay | Admitting: Neurology

## 2016-01-21 DIAGNOSIS — R200 Anesthesia of skin: Secondary | ICD-10-CM

## 2016-01-21 DIAGNOSIS — R42 Dizziness and giddiness: Secondary | ICD-10-CM

## 2016-01-21 DIAGNOSIS — Z0289 Encounter for other administrative examinations: Secondary | ICD-10-CM

## 2016-01-21 DIAGNOSIS — G629 Polyneuropathy, unspecified: Secondary | ICD-10-CM

## 2016-01-21 DIAGNOSIS — R202 Paresthesia of skin: Secondary | ICD-10-CM

## 2016-01-21 DIAGNOSIS — H539 Unspecified visual disturbance: Secondary | ICD-10-CM

## 2016-01-21 IMAGING — MR MR HEAD WO/W CM
9 of 12 series · 35 of 48 positions shown · IV contrast (multihance)
Comparison: None.

CLINICAL DATA: 49-year-old female with numbness and tingling in
both upper extremities which began over the last 3 days. Visual
changes. Initial encounter.

EXAM:
MRI HEAD WITHOUT AND WITH CONTRAST
TECHNIQUE: Multiplanar, multiecho pulse sequences of the brain and surrounding
structures were obtained without and with intravenous contrast.
CONTRAST:  15mL MULTIHANCE GADOBENATE DIMEGLUMINE 529 MG/ML IV SOLN

[Series 3: T1 · sagittal · 5.0mm · 0.47mm/px · 1 of 24 slices shown]
[im 1/24]
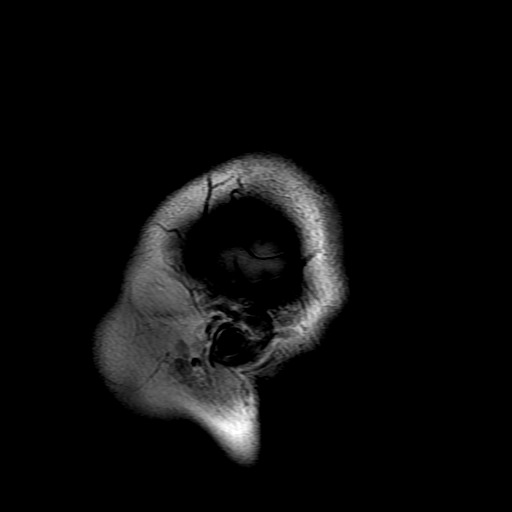

[Series 4: DWI · axial · 3.0mm · 1.09mm/px · z∈[-36,+108]mm · 9 of 98 slices shown (1 of 4)]
[im 1/98]
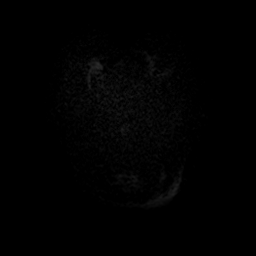
[im 13/98]
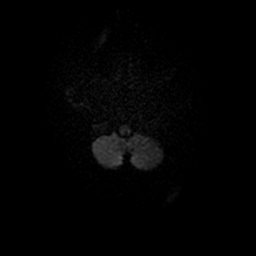
[im 25/98]
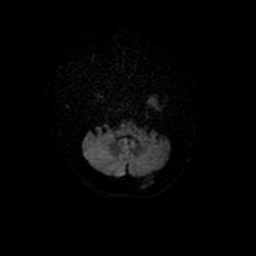
[im 37/98]
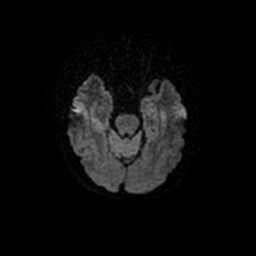
[im 49/98]
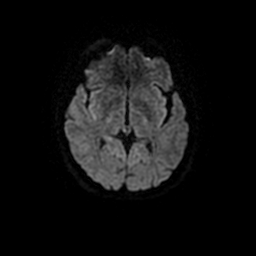
[im 61/98]
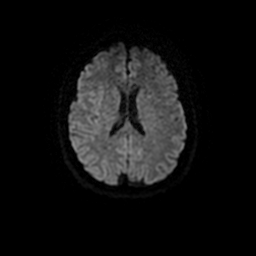
[im 73/98]
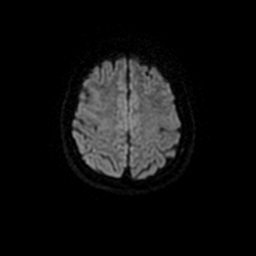
[im 85/98]
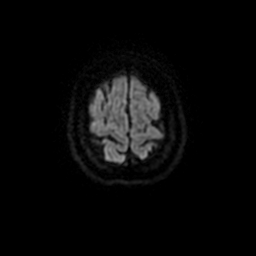
[im 98/98]
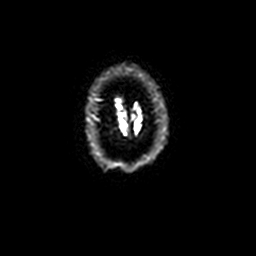

[Series 5: DWI · coronal · 5.0mm · 1.09mm/px · 7 of 72 slices shown (2 of 4)]
[im 1/72]
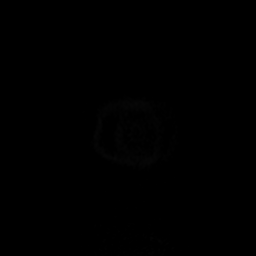
[im 12/72]
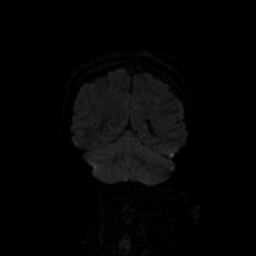
[im 24/72]
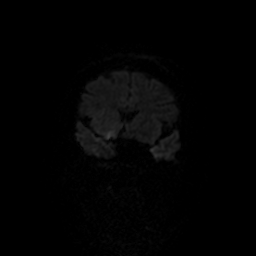
[im 36/72]
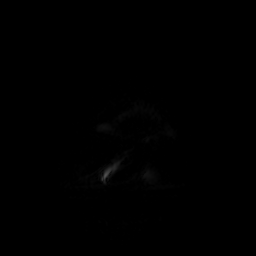
[im 48/72]
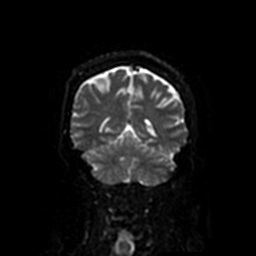
[im 60/72]
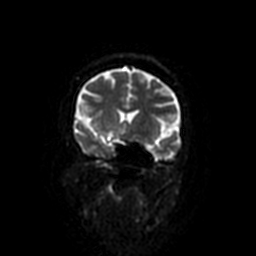
[im 72/72]
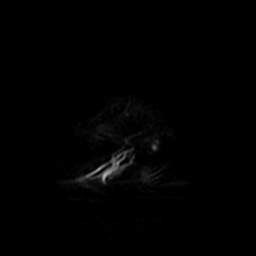

[Series 6: T2 · axial · 5.0mm · 0.43mm/px · z∈[-45,+104]mm · 2 of 24 slices shown (1 of 2)]
[im 1/24]
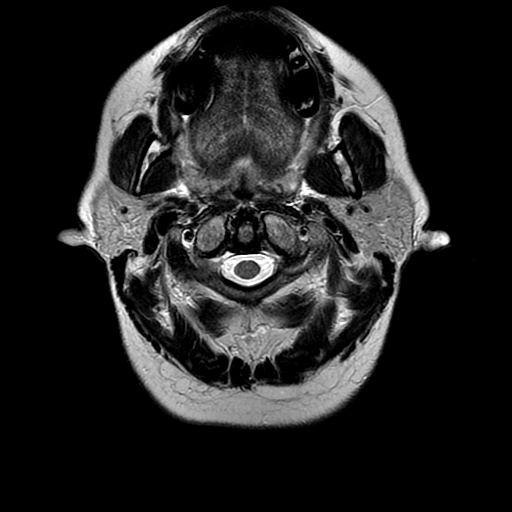
[im 24/24]
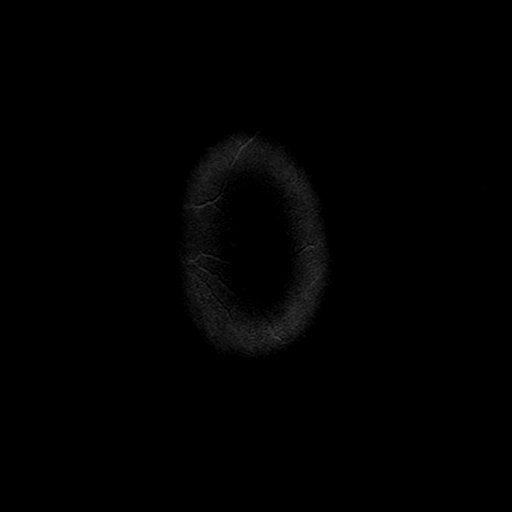

[Series 7: FLAIR · axial · 5.0mm · 0.43mm/px · z∈[-51,+110]mm · 2 of 24 slices shown]
[im 1/24]
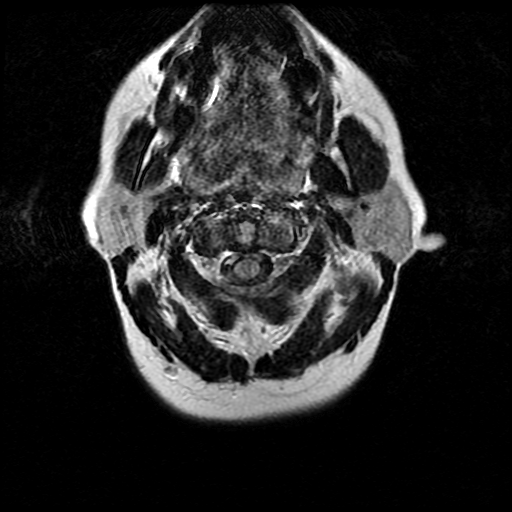
[im 24/24]
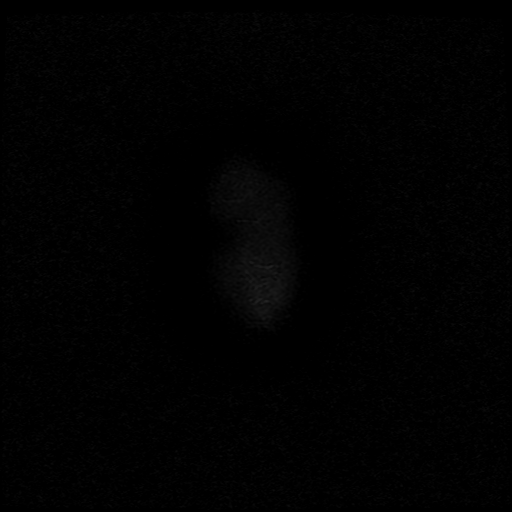

[Series 10: T2 · coronal · 5.0mm · 0.45mm/px · 3 of 29 slices shown (2 of 2)]
[im 1/29]
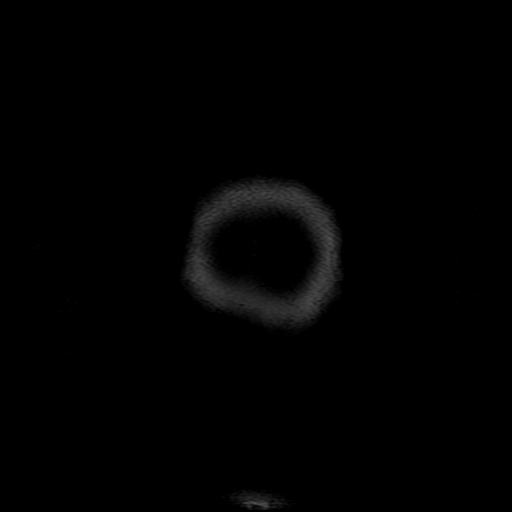
[im 15/29]
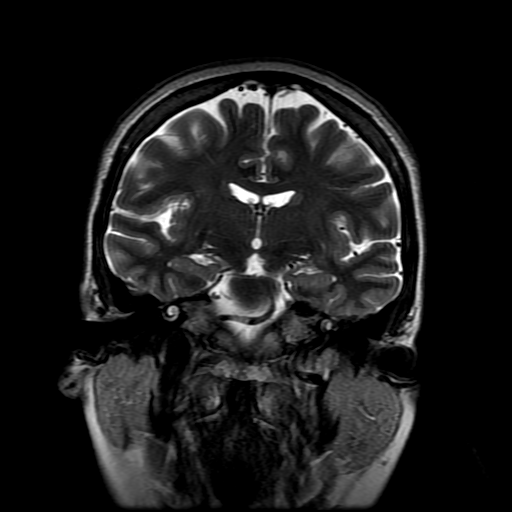
[im 29/29]
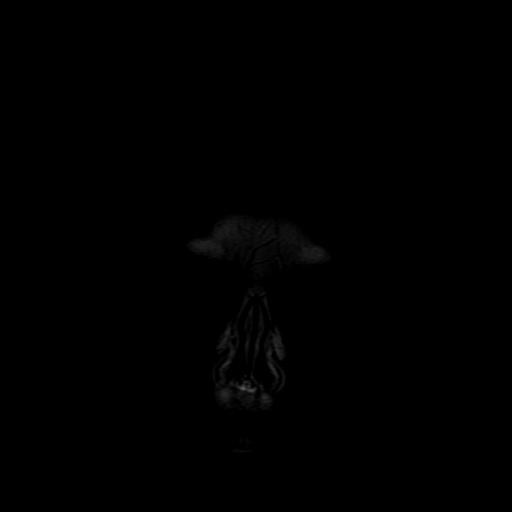

[Series 12: T1 post-contrast · coronal · 5.0mm · 0.45mm/px · 3 of 29 slices shown]
[im 1/29]
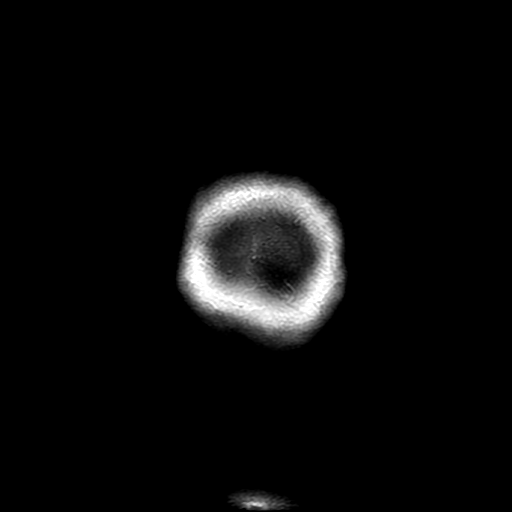
[im 15/29]
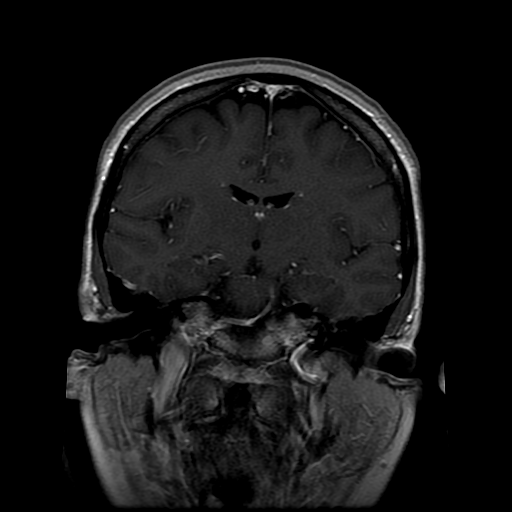
[im 29/29]
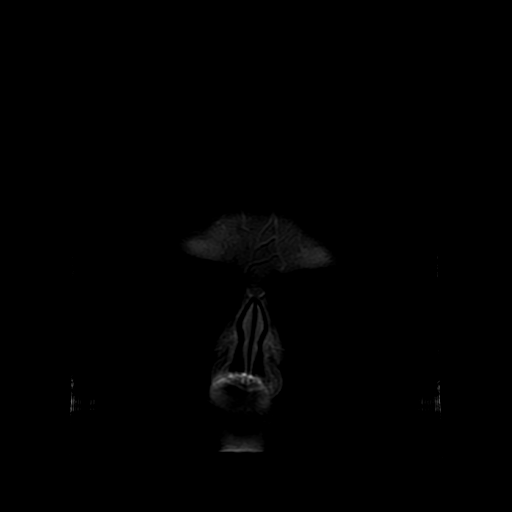

[Series 400: DWI · axial · 3.0mm · 1.09mm/px · z∈[-36,+108]mm · 5 of 49 slices shown (3 of 4)]
[im 1/49]
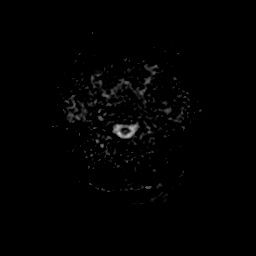
[im 13/49]
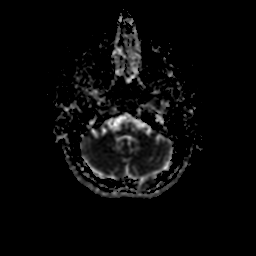
[im 25/49]
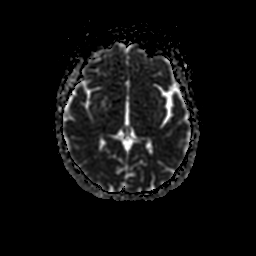
[im 37/49]
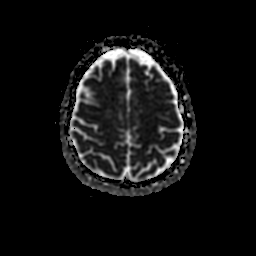
[im 49/49]
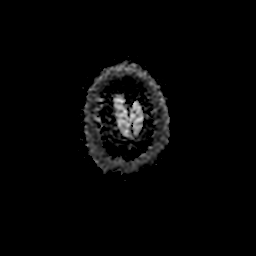

[Series 500: DWI · coronal · 5.0mm · 1.09mm/px · 3 of 36 slices shown (4 of 4)]
[im 1/36]
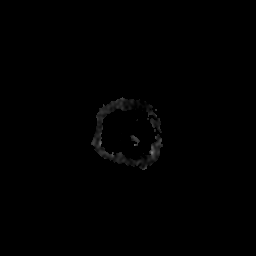
[im 18/36]
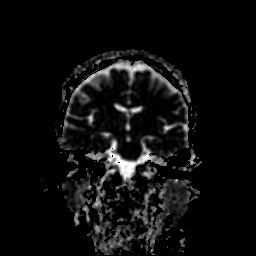
[im 36/36]
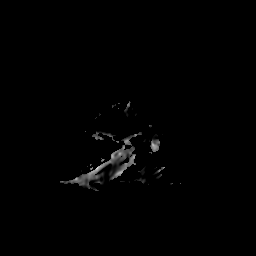

[35 of 48 positions shown; findings below may reference images not displayed]

FINDINGS: Cerebral volume is normal. No restricted diffusion to suggest acute
infarction. No midline shift, mass effect, evidence of mass lesion,
ventriculomegaly, extra-axial collection or acute intracranial
hemorrhage. Cervicomedullary junction and pituitary are within
normal limits. Negative visualized cervical spine. Major
intracranial vascular flow voids are preserved, there is mild
intracranial artery tortuosity, more so in the posterior fossa.

Gray and white matter signal appears essentially normal for age.
There is a tiny nonspecific focus of T2 hyperintensity in the right
inferior cerebellum on series 6, image 5, which most resembles a
tiny chronic lacune. No chronic cerebral blood products. No abnormal
enhancement identified. Optic chiasm and cavernous sinuses appear
normal. No orbit soft tissue abnormality identified.

Mastoids are clear. Paranasal sinuses are clear. Negative scalp soft
tissues. Normal bone marrow signal.
IMPRESSION: No acute intracranial abnormality and essentially normal for age MRI
appearance of the brain.

## 2016-01-22 ENCOUNTER — Other Ambulatory Visit (INDEPENDENT_AMBULATORY_CARE_PROVIDER_SITE_OTHER): Payer: Self-pay

## 2016-01-22 ENCOUNTER — Ambulatory Visit (INDEPENDENT_AMBULATORY_CARE_PROVIDER_SITE_OTHER): Payer: BLUE CROSS/BLUE SHIELD | Admitting: Neurology

## 2016-01-22 DIAGNOSIS — R202 Paresthesia of skin: Secondary | ICD-10-CM

## 2016-01-22 DIAGNOSIS — R42 Dizziness and giddiness: Secondary | ICD-10-CM

## 2016-01-22 DIAGNOSIS — R2990 Unspecified symptoms and signs involving the nervous system: Secondary | ICD-10-CM

## 2016-01-22 DIAGNOSIS — H539 Unspecified visual disturbance: Secondary | ICD-10-CM

## 2016-01-22 DIAGNOSIS — R200 Anesthesia of skin: Secondary | ICD-10-CM

## 2016-01-22 DIAGNOSIS — Z0289 Encounter for other administrative examinations: Secondary | ICD-10-CM

## 2016-01-22 NOTE — Procedures (Signed)
     History: Shelley Zimmerman is a 50 year old patient with a history episodes of left arm numbness and stiffness, with some involvement of the left leg as well. The patient has had a relatively unremarkable MRI the brain, she is being evaluated for the above sensory events.  This is a routine EEG. No skull defects are noted. Medications include clonazepam, ibuprofen, multivitamins, Benicar, Zofran, Valtrex, and Zomig.  EEG classification: Dysrhythmia grade 1 generalized  Description of the recording: The background rhythms of this recording consists of a relatively well-modulated medium amplitude alpha rhythm of 10 Hz that is reactive to eye opening and closure. As the record progresses, photic stimulation is performed, this results in a bilateral and symmetric photic response. Hyperventilation is then performed, with a good buildup of the background rhythm activities with symmetric slowing seen. Intermittently during the recording there appears to be bursts of generalized theta frequency slowing in the 4-6 Hz range, which is more prominent following hyperventilation, but is seen off and on throughout the recording. The bursts of slowing last up to 1.5 seconds, and are bilaterally symmetric. At no time during the recording does there appear to be evidence of spike or spike-wave discharges. EKG monitor shows no evidence of cardiac rhythm abnormalities with a heart rate of 72.  Impression: This is an abnormal EEG recording due to episodic bursts of theta frequency slowing that are generalized in nature. There are no clear epileptiform discharges, but this study suggests mild bihemispheric dysfunction, or some dysfunction of the the midline nuclei. The episodic slowing events are paroxysmal.

## 2016-01-22 NOTE — Progress Notes (Signed)
  [id number]    Provider:  Dr Jaynee Eagles Referring Provider: [name], MD Primary Care Physician:  [name], MD  HPI:  Shelley Zimmerman is a 50 y.o. female here as a referral from Dr. Maudie Mercury for left-sided sensory symptoms. PMHx of HTN, catheterization, migraine. Started the 4th or 5th of December. She started taking Zoloft a few days before and she also had heart cath and ablation. The pain starts on the top of her head to the base of the neck down both arms like heat. Then tingling and numbness on the left side of the arm. The tingling radiates down the medial arm to the 5th digit and down the leg as well. Symptoms are improving.  Summary  Nerve conduction studies were performed on the bilateral upper and lower extremities:  The bilateral Median motor nerves showed normal conductions with normal F Wave latency The bilateral Ulnar motor nerves showed normal conductions with normal F Wave latency The bilateral Peroneal motor nerves showed normal conductions with normal F Wave latency The bilateral Tibial motor nerves showed normal conductions with normal F Wave latency The bilateral second-digit Median sensory nerves were within normal limits The bilateral fifth-digit Ulnar sensory nerves were within normal limits The bilateral Sural sensory nerves were within normal limits Bilateral H Reflexes showed normal latencies  EMG Needle study was performed on selected left upper and left lower extremity muscles:   The Deltoid, Triceps, Pronator Teres, Opponens Pollicis, First Dorsal interosseous, Biceps Femoris(L), Gluteus Medius and Maximus, Vastus Medialis,  Anterior Tibialis, Medial Gastrocnemius, Extensor Hallucis Longus muscles and L5/S1, C7/C8 paraspinals were within normal limits.  Conclusion: This is a normal study. No electrophysiologic evidence for ulnar or median neuropathy, peripheral polyneuropathy, cervical or lumbar radiculopathy, muscle disorder.

## 2016-01-22 NOTE — Progress Notes (Signed)
See procedure note.

## 2016-01-25 LAB — B12 AND FOLATE PANEL
Folate: 10.7 ng/mL (ref >3.0)
Vitamin B-12: 619 pg/mL (ref 211–946)

## 2016-01-25 LAB — MULTIPLE MYELOMA PANEL, SERUM
ALBUMIN/GLOB SERPL: 1.7 (ref 0.7–1.7)
ALPHA2 GLOB SERPL ELPH-MCNC: 0.6 g/dL (ref 0.4–1.0)
Albumin SerPl Elph-Mcnc: 4.2 g/dL (ref 2.9–4.4)
Alpha 1: 0.1 g/dL (ref 0.0–0.4)
B-GLOBULIN SERPL ELPH-MCNC: 1.1 g/dL (ref 0.7–1.3)
GLOBULIN, TOTAL: 2.5 g/dL (ref 2.2–3.9)
Gamma Glob SerPl Elph-Mcnc: 0.7 g/dL (ref 0.4–1.8)
IGA/IMMUNOGLOBULIN A, SERUM: 140 mg/dL (ref 87–352)
IgG (Immunoglobin G), Serum: 937 mg/dL (ref 700–1600)
IgM (Immunoglobulin M), Srm: 47 mg/dL (ref 26–217)

## 2016-01-25 LAB — HIV ANTIBODY (ROUTINE TESTING W REFLEX): HIV SCREEN 4TH GENERATION: NONREACTIVE

## 2016-01-25 LAB — COMPREHENSIVE METABOLIC PANEL
ALBUMIN: 4.6 g/dL (ref 3.5–5.5)
ALK PHOS: 52 IU/L (ref 39–117)
ALT: 23 IU/L (ref 0–32)
AST: 19 IU/L (ref 0–40)
Albumin/Globulin Ratio: 2.2 (ref 1.1–2.5)
BILIRUBIN TOTAL: 0.3 mg/dL (ref 0.0–1.2)
BUN / CREAT RATIO: 23 (ref 9–23)
BUN: 16 mg/dL (ref 6–24)
CALCIUM: 9.8 mg/dL (ref 8.7–10.2)
CHLORIDE: 97 mmol/L (ref 96–106)
CO2: 23 mmol/L (ref 18–29)
Creatinine, Ser: 0.7 mg/dL (ref 0.57–1.00)
GFR calc non Af Amer: 102 mL/min/{1.73_m2} (ref >59)
GFR, EST AFRICAN AMERICAN: 118 mL/min/{1.73_m2} (ref >59)
GLOBULIN, TOTAL: 2.1 g/dL (ref 1.5–4.5)
Glucose: 101 mg/dL — ABNORMAL HIGH (ref 65–99)
Potassium: 4.1 mmol/L (ref 3.5–5.2)
SODIUM: 139 mmol/L (ref 134–144)
Total Protein: 6.7 g/dL (ref 6.0–8.5)

## 2016-01-25 LAB — HEAVY METALS, BLOOD
Arsenic: 6 ug/L (ref 2–23)
Lead, Blood: NOT DETECTED ug/dL (ref 0–19)
MERCURY: 1 ug/L (ref 0.0–14.9)

## 2016-01-25 LAB — VITAMIN B1: THIAMINE: 122.6 nmol/L (ref 66.5–200.0)

## 2016-01-25 LAB — METHYLMALONIC ACID, SERUM: METHYLMALONIC ACID: 108 nmol/L (ref 0–378)

## 2016-01-25 LAB — TISSUE TRANSGLUTAMINASE, IGA: Transglutaminase IgA: <2 U/mL (ref 0–3)

## 2016-01-25 LAB — TSH: TSH: 1.47 u[IU]/mL (ref 0.450–4.500)

## 2016-01-25 LAB — B. BURGDORFI ANTIBODIES: Lyme IgG/IgM Ab: <0.91 {ISR} (ref 0.00–0.90)

## 2016-01-25 LAB — RPR: RPR: NONREACTIVE

## 2016-01-25 LAB — VITAMIN B6: Vitamin B6: 22.2 ug/L (ref 2.0–32.8)

## 2016-01-25 LAB — GLIADIN ANTIBODIES, SERUM
Antigliadin Abs, IgA: 6 units (ref 0–19)
Gliadin IgG: 3 units (ref 0–19)

## 2016-01-25 LAB — ANGIOTENSIN CONVERTING ENZYME: Angio Convert Enzyme: 31 U/L (ref 14–82)

## 2016-01-25 LAB — HEPATITIS C ANTIBODY

## 2016-01-28 ENCOUNTER — Telehealth: Payer: Self-pay | Admitting: Cardiovascular Disease

## 2016-01-29 NOTE — Telephone Encounter (Signed)
Close encounter 

## 2016-01-30 ENCOUNTER — Ambulatory Visit: Payer: BLUE CROSS/BLUE SHIELD | Admitting: Cardiovascular Disease

## 2016-01-31 ENCOUNTER — Telehealth: Payer: Self-pay | Admitting: Neurology

## 2016-01-31 NOTE — Telephone Encounter (Signed)
Made 3 month f/u on 04/29/16 at 230p, check in 215p. Pt verbalized understanding.

## 2016-01-31 NOTE — Telephone Encounter (Signed)
Called pt back. She wanted to know what EEG results were and labs results. She has not heard back about this. Advised I will speak to Dr Jaynee Eagles and call her back once I spoke to her. She verbalized understanding.

## 2016-01-31 NOTE — Telephone Encounter (Signed)
Pt called and would like results on EEG, NCS and EMG. Please call and advise [phone]

## 2016-01-31 NOTE — Telephone Encounter (Signed)
See phone note

## 2016-01-31 NOTE — Telephone Encounter (Signed)
Discussed with patient. Labs were all unremarkable. She is having unilateral and bilateral paresthesias. EEG was read as abnormal. MRi of the brain was negative and eeg without epileptiform activity. Unclear if findings have any clincial relevance. Discussed repeat EEG, patient decided to hold off. Patient will follow with me in 3 months.   Terrence Dupont - can you call and get her an appointment in 3 months?   EEG:  This is an abnormal EEG recording due to episodic  bursts of theta frequency slowing that are generalized in nature.  There are no clear epileptiform discharges, but this study suggests mild bihemispheric dysfunction, or some dysfunction of the the midline nuclei. The episodic slowing events are paroxysmal.

## 2016-02-05 ENCOUNTER — Ambulatory Visit (INDEPENDENT_AMBULATORY_CARE_PROVIDER_SITE_OTHER): Payer: BLUE CROSS/BLUE SHIELD | Admitting: Cardiovascular Disease

## 2016-02-05 ENCOUNTER — Encounter: Payer: Self-pay | Admitting: Cardiovascular Disease

## 2016-02-05 VITALS — BP 124/92 | HR 81 | Ht 62.0 in | Wt 175.5 lb

## 2016-02-05 DIAGNOSIS — I10 Essential (primary) hypertension: Secondary | ICD-10-CM | POA: Diagnosis not present

## 2016-02-05 DIAGNOSIS — R42 Dizziness and giddiness: Secondary | ICD-10-CM | POA: Diagnosis not present

## 2016-02-05 DIAGNOSIS — M5030 Other cervical disc degeneration, unspecified cervical region: Secondary | ICD-10-CM

## 2016-02-05 DIAGNOSIS — Z8669 Personal history of other diseases of the nervous system and sense organs: Secondary | ICD-10-CM

## 2016-02-05 NOTE — Patient Instructions (Signed)
Your physician has recommended you make the following change in your medication:   The Benicar has been decreased to 20 mg daily ( 1/2 tablet)  Your physician recommends that you schedule a follow-up appointment as needed.

## 2016-02-07 ENCOUNTER — Encounter: Payer: Self-pay | Admitting: Cardiovascular Disease

## 2016-02-07 DIAGNOSIS — M5030 Other cervical disc degeneration, unspecified cervical region: Secondary | ICD-10-CM | POA: Insufficient documentation

## 2016-02-07 NOTE — Progress Notes (Signed)
Patient ID: Shelley Zimmerman, female   DOB: 04/23/1966, 50 y.o.   MRN: [id number]     Primary MD: [name], MD  PATIENT PROFILE: Shelley Zimmerman is a 50 y.o. female who wsreferred by Dr. [name] for evaluation of labile hypertension as well as intermittent arm tingling.  Saw her for initial evaluation on 12/18/2015.  She presents for follow-up evaluation.   HPI:  MICKEY HEBEL has a history of hypertension, documented since 2011.  In 2011 she had experienced some vague chest pain.  A stress test was reportedly borderline abnormal, suggestive of possible distal anterior ischemia.  She has a history of obesity.  Due to recurrent chest pain she ultimately underwent cardiac catheterization in October 2011 by Dr. Ellyn Hack, which revealed normal coronary arteries.  With her hypertensive history distal aortography  was also performed and she had widely patent renal arteries without evidence for renal artery stenosis.  The patient has a history of migraine headaches as well as nephrolithiasis.  Over the last several months, she has had issues with dizziness when she looks up or down.  An MRI of her head apparently was normal.  She recently has been having episodes of tingling in her arms without chest pain which at times wakes her up at night.  She does admit to some neck discomfort.  She admits to some visual issues and when she feels this her blood pressure comes labile and her heart rate is fast.  She denies syncope.  She denies any flushing.  She denies any orthostatic symptomatology.  She recently had seen a neurologist and according to the patient, he was more concerned with her history of migraine headaches than her current symptoms.  She tells me she will be seeing another neurology group in the imminent future.  In September she underwent a CT of her chest which did not reveal interstitial lung disease but showed mild air trapping indicative of mild small airway disease.  On 10/25/2015 a 2-D echo  Doppler study showed an ejection fraction at 65-70% without wall motion abnormalities.  There was grade 1 diastolic dysfunction.  There was slight increased echogenicity on the ventricular surface of the distal left ventricular outflow tract was felt that this most likely represented lateral contours of the out flow tract and no further workup was indicated although a TEE would be helpful for further clarification.  On 11/01/2015 a right heart catheterization procedure by Dr. Aundra Dubin revealed low right heart pressures suggestive of dehydration.  RA mean pressure of 3, RV pressure 18 over 1, PA pressure 17 over 3 with a mean of 9 and a mean wedge pressure of 3.  Cardiac output was 3.4 and 3.6 with an index of 1.9 and 2.1 by the Fick and thermodilution methods.  Since I saw her, she wore a cardiac monitor which revealed normal sinus rhythm throughout without ectopy or bradycardic episodes.  There is an episode of mild increased heart rate at 103, but this was sinus rhythm, during the middle portion of the day when the patient was at work.  She underwent a thorough neurological evaluation by Dr. Sarina Ill for episodic left-sided sensory symptoms and had extensive laboratory which I reviewed with the patient today.  She had a normal MRI of the brain and her neuro exam was nonfocal.  He also underwent EEG evaluation and EMG of left arm and left leg.  Ms. Orser feels that her blood pressure has stabilized on her current medical regimen.  She  thinks that her initial episode was in the setting of scopolamine take it with her Zoloft and Klonopin.  She denies shortness of breath.  She is unaware of palpitations.  She presents for reevaluation.  Past Medical History  Diagnosis Date  . Hypertension   . S/P cardiac cath 2011  . Kidney stones   . GERD (gastroesophageal reflux disease)   . Migraine   . Hypothyroidism     has resolved   . Depression   . Anxiety   . Vaginal delivery 1996    Past Surgical  History  Procedure Laterality Date  . Lithotripsy  2007  . Cardiac catheterization N/A 11/01/2015    Procedure: Right Heart Cath;  Surgeon: Larey Dresser, MD;  Location: [facility];  Service: Cardiovascular;  Laterality: N/A;  . Ureteroscopy with holmium laser lithotripsy    . Dilation and curettage of uterus  1995, 1996, 1996  . Hysteroscopy with novasure N/A 11/23/2015    Procedure: HYSTEROSCOPY WITH NOVASURE AND Emi Holes;  Surgeon: Alden Hipp, MD;  Location: [facility];  Service: Gynecology;  Laterality: N/A;    Allergies  Allergen Reactions  . Diovan [Valsartan] Swelling    Facial swelling  . Wellbutrin [Bupropion]     agitation  . Zoloft [Sertraline Hcl]     tingling    Current Outpatient Prescriptions  Medication Sig Dispense Refill  . clonazePAM (KLONOPIN) 0.5 MG tablet Take 0.5 mg by mouth at bedtime as needed (For sleep.).     Marland Kitchen ibuprofen (ADVIL,MOTRIN) 200 MG tablet Take 200 mg by mouth as directed.     . Multiple Vitamin (MULTIVITAMIN) capsule Take 1 capsule by mouth daily.    Marland Kitchen olmesartan (BENICAR) 40 MG tablet Take 20 mg by mouth daily.    . ondansetron (ZOFRAN) 4 MG tablet Take 4 mg by mouth as directed.    . valACYclovir (VALTREX) 1000 MG tablet Take 1,000 mg by mouth as directed.     . zolmitriptan (ZOMIG) 5 MG nasal solution Place 5 mg into the nose as directed.     No current facility-administered medications for this visit.   Facility-Administered Medications Ordered in Other Visits  Medication Dose Route Frequency Provider Last Rate Last Dose  . gadopentetate dimeglumine (MAGNEVIST) injection 15 mL  15 mL Intravenous Once PRN Melvenia Beam, MD        Social History   Social History  . Marital Status: Married    Spouse Name: [name]  . Number of Children: 1  . Years of Education: 16   Occupational History  . office manager    Social History Main Topics  . Smoking status: Never Smoker   . Smokeless tobacco: Never Used  . Alcohol Use: 0.0  oz/week    0 Standard drinks or equivalent per week     Comment: occassional  . Drug Use: No  . Sexual Activity: Not on file   Other Topics Concern  . Not on file   Social History Narrative   Lives with husband and son      Caffeine use:  1-2 cups coffee per day   Additional social history is notable that she is married for 23 years.  She has one child, a son who is age 98.  There is no history of tobacco use.  She rarely drinks alcohol, which typically is beer or wine.  She does not routinely exercise but she just joined a gym and plans to initiate an exercise program.  Family History  Problem Relation Age of Onset  . Diabetes Mellitus II Father   . Hypertension Father   . Hypothyroidism Sister   . Cardiomyopathy Mother   . Lung cancer Paternal Grandfather   . Diabetes Sister   . Thyroid disease Sister   . Stroke Neg Hx   . Seizures Neg Hx    Additional family history is notable in that her mother died at age 52 suddenly.  Her father is living and has a history of hypertension and is diabetic.  She has one brother age 18 with thyroid abnormality and 2 sisters, ages 62 and 71, 1 who has thyroid abnormalities and is diabetic.  ROS General: Negative; No fevers, chills, or night sweats HEENT: Negative; No changes in vision or hearing, sinus congestion, difficulty swallowing Pulmonary: Negative; No cough, wheezing, shortness of breath, hemoptysis Cardiovascular:  See HPI;  GI: Negative; No nausea, vomiting, diarrhea, or abdominal pain GU: Negative; No dysuria, hematuria, or difficulty voiding Musculoskeletal: Negative; no myalgias, joint pain, or weakness Hematologic/Oncologic: Negative; no easy bruising, bleeding Endocrine: Negative; no heat/cold intolerance; no diabetes Neuro: History of migraine headaches Skin: Negative; No rashes or skin lesions Psychiatric: Negative; No behavioral problems, depression Sleep: Negative; No daytime sleepiness, hypersomnolence, bruxism,  restless legs, hypnogagnic hallucinations Other comprehensive 14 point system review is negative   Physical Exam BP 124/92 mmHg  Pulse 81  Ht '5\' 2"'$  (1.575 m)  Wt 175 lb 8 oz (79.606 kg)  BMI 32.09 kg/m2  Repeat blood pressure by me was 110/70, supine and 108/74  Wt Readings from Last 3 Encounters:  02/05/16 175 lb 8 oz (79.606 kg)  12/31/15 173 lb (78.472 kg)  12/27/15 173 lb 9.6 oz (78.744 kg)   General: Alert, oriented, no distress.  Skin: normal turgor, no rashes, warm and dry HEENT: Normocephalic, atraumatic. Pupils equal round and reactive to light; sclera anicteric; extraocular muscles intact; Fundi without hemorrhages or exudates Nose without nasal septal hypertrophy Mouth/Parynx benign; Mallinpatti scale 3 Neck: No JVD, no carotid bruits; normal carotid upstroke Lungs: clear to ausculatation and percussion; no wheezing or rales Chest wall: without tenderness to palpitation Heart: PMI not displaced, RRR, s1 s2 normal, 1/6 systolic murmur, no diastolic murmur, no rubs, gallops, thrills, or heaves Abdomen: soft, nontender; no hepatosplenomehaly, BS+; abdominal aorta nontender and not dilated by palpation. Back: no CVA tenderness Pulses 2+ Musculoskeletal: full range of motion, normal strength, no joint deformities Extremities: no clubbing cyanosis or edema, Homan's sign negative  Neurologic: grossly nonfocal; Cranial nerves grossly wnl Psychologic: Normal mood and affect  ECG (independently read by me): Normal sinus rhythm at 81 bpm.  PR interval 158 ms, QTc interval 413 ms.  No ST-T changes.  12/18/2015 ECG (independently read by me): Normal sinus rhythm at 83 bpm.  No ectopy.  Normal intervals.  LABS:  BMP Latest Ref Rng 01/22/2016 12/18/2015 11/01/2015  Glucose 65 - 99 mg/dL 101(H) 83 102(H)  BUN 6 - 24 mg/dL '16 11 11  '$ Creatinine 0.57 - 1.00 mg/dL 0.70 0.71 0.73  BUN/Creat Ratio 9 - 23 23 - -  Sodium 134 - 144 mmol/L 139 140 137  Potassium 3.5 - 5.2 mmol/L 4.1 4.4  4.1  Chloride 96 - 106 mmol/L 97 105 106  CO2 18 - 29 mmol/L '23 26 24  '$ Calcium 8.7 - 10.2 mg/dL 9.8 9.5 9.3     Hepatic Function Latest Ref Rng 01/22/2016 12/18/2015  Total Protein 6.0 - 8.5 g/dL 6.7 6.4  Albumin 3.5 - 5.5 g/dL 4.6 4.3  AST 0 - 40 IU/L 19 13  ALT 0 - 32 IU/L 23 16  Alk Phosphatase 39 - 117 IU/L 52 42  Total Bilirubin 0.0 - 1.2 mg/dL 0.3 0.6    CBC Latest Ref Rng 12/18/2015 11/01/2015 10/12/2015  WBC 4.0 - 10.5 K/uL 5.7 4.5 4.9  Hemoglobin 12.0 - 15.0 g/dL 13.1 13.6 13.2  Hematocrit 36.0 - 46.0 % 38.5 39.8 39.1  Platelets 150 - 400 K/uL 235 224 220   Lab Results  Component Value Date   MCV 90.8 12/18/2015   MCV 91.7 11/01/2015   MCV 91.8 10/12/2015   Lab Results  Component Value Date   TSH 1.470 01/22/2016   No results found for: HGBA1C   BNP    Component Value Date/Time   BNP 6.7 10/12/2015 1001    ProBNP No results found for: PROBNP   Lipid Panel  No results found for: CHOL, TRIG, HDL, CHOLHDL, VLDL, LDLCALC, LDLDIRECT  RADIOLOGY: No results found.   ASSESSMENT AND PLAN: Ms.  Reigan Tolliver is a 50 year old female who has a five-year history of hypertension in the past has had episodes of somewhat atypical chest pain.  Cardiac catheterization in 2011 demonstrated normal coronary arteries, and at that time she had widely patent renal arteries noted on distal aortography.  She has a history of migraine headaches.  She recently had some neck discomfort and arm tingling particular during the night.  Has been associated with a sensation of increased blood pressure and heart rate.  She recently had had a right heart catheterization which revealed that she most likely was dehydrated and had a very low PCWP.  When I initially saw her , she was not orthostatic orthostatic and her blood pressure was well controlled on her current therapy consisting of Benicar 40 mg daily.  Reportedly, there is an allergy to Diovan.  I reviewed her cardiac monitor in detail and  this did not demonstrate any arrhythmogenic etiology and showed entirely sinus rhythm without ectopy.  In 2011 she had normal renal arteries arguing against a renal vascular etiology to her hypertension.  I saw her, I was concerned that she may have degenerative cervical disc disease, which may be contributing to some of her arm tingling and neck discomfort.  She tells me she did have an MRI and also was found to have 3 cervical disks with foraminal narrowing.  I reviewed the neurologic evaluation in the comprehensive laboratory assessment that was subsequently done.  At present, I do not believe the patient has secondary hypertension but if marked blood pressure lability continues an abdominal CT scan to assess her adrenal gland, 24 urine studies to assess for possible pheochromocytoma or carcinoid and a follow-up renal duplex scan may be indicated.  With her blood pressure being somewhat on the low side, I have suggested that she reduce her Benicar to 20 mg daily from her present dose of 40 mg.  She continues to be on pravastatin for hyperlipidemia.  She will return to the primary care of Dr. Maudie Mercury.  I will be happy to see her in the future if problems arise.  Troy Sine, MD, [facility] 02/07/2016 12:55 PM

## 2016-04-01 ENCOUNTER — Ambulatory Visit: Payer: BLUE CROSS/BLUE SHIELD | Admitting: Neurology

## 2016-04-24 ENCOUNTER — Telehealth: Payer: Self-pay | Admitting: Neurology

## 2016-04-24 NOTE — Telephone Encounter (Signed)
I don;t know why they have not released, sorry. They usually realease automatically, I don;t do anything special toget them released. She can get them from medical records or possibly she can call mychart and see wht she does not have them, I bet there is a patient assistance number?

## 2016-04-24 NOTE — Telephone Encounter (Signed)
Dr Jaynee Eagles- I looked and MRI was done back in January and labs back in Feb. The labs have not released to her mychart yet. Is there a way you can release those so the pt can view them?

## 2016-04-24 NOTE — Telephone Encounter (Signed)
Checked on status of results released on mychart. Pt should be able to see both lab results and MRI cervical spine.  Called pt. She verified she can see everything. Told her to call back if she has further questions or concerns.

## 2016-04-24 NOTE — Telephone Encounter (Addendum)
Patient called to request results of labwork and MRI summary. Patient prefers results to be sent by e-mail/MyChart.

## 2016-04-29 ENCOUNTER — Ambulatory Visit (INDEPENDENT_AMBULATORY_CARE_PROVIDER_SITE_OTHER): Payer: BLUE CROSS/BLUE SHIELD | Admitting: Neurology

## 2016-04-29 VITALS — BP 142/85 | HR 87 | Ht 62.0 in | Wt 181.6 lb

## 2016-04-29 DIAGNOSIS — IMO0001 Reserved for inherently not codable concepts without codable children: Secondary | ICD-10-CM

## 2016-04-29 DIAGNOSIS — R202 Paresthesia of skin: Secondary | ICD-10-CM | POA: Diagnosis not present

## 2016-04-29 DIAGNOSIS — IMO0002 Reserved for concepts with insufficient information to code with codable children: Principal | ICD-10-CM

## 2016-04-29 NOTE — Progress Notes (Signed)
[id number]    Provider:  Dr Jaynee Eagles Referring Provider: [name], MD Primary Care Physician:  [name], MD  CC: Left-sided sensory symptoms  Interval history 04/29/2016:   She returns today for different symptoms. Her left-sided symptoms have resolved and now she has new symptoms. Her thumb sometimes moves back and forth. Electrical zaps in the legs and the feet. Mostly on the left but also on the right. She has had a few "weird things" happen. She has woken up at 2am or 3am and she feels like she is vibrating. Feels it in her head. And in her chest. If she breathes it goes away in 10 minutes. It wakes her up. Vibration mostly in her chest. The other issues in her left side have gone away. She has gained 20 pounds. She has weaknes in the left leg still. There is white floting objects in her urine. She has gained urine. Jojnts sore and stiff. Discussed EEG and MRI of the cervical spine. She also gets dizzy when she puts her down and out.  MRI of the cervical spine 01/03/2016:  Abnormal MRI cervical spine (with and without) demonstrating: 1. At C3-4: disc bulging, uncovertebral joint hypertrophy and facet hypertrophy with moderate left foraminal stenosis 2. At C6-7: uncovertebral joint hypertrophy and facet hypertrophy with moderate left foraminal stenosis    EEG 01/22/2016: This is an abnormal EEG recording due to episodic bursts of theta frequency slowing that are generalized in nature. There are no clear epileptiform discharges, but this study suggests mild bihemispheric dysfunction, or some dysfunction of the the midline nuclei. The episodic slowing events are paroxysmal.  EMG/NCS 01/2016: This is a normal study. No electrophysiologic evidence for ulnar or median neuropathy, peripheral polyneuropathy, cervical or lumbar radiculopathy, muscle disorder.   HPI: Shelley Zimmerman is a 50 y.o. female here as a referral from Dr. Maudie Mercury for left-sided sensory symptoms. PMHx of  HTN, catheterization, migraine. Started the 4th or 5th of December. She started taking Zoloft a few days before and she also had heart cath and ablation. The pain starts on the top of her head to the base of the neck down both arms like heat. Then tingling and numbness on the left side of the arm. The tingling radiates down the medial arm to the 5th digit and down the leg as well. She has numbness on the left arm and left leg, the muscle feels rigid. She would wake up with a pounding heart and her blood pressure was spiking up to 150-160/130 and would be associated with the symptoms. Left side of the face tingling. Would continue for several hours, at least 3. Vision blurry. Frequency increased from just at night to multiple times a day. 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 It all started after a heart catheterization and ablation and around the time she started taking zoloft with a lot of stress. Vision has continuously been blurry.   Reviewed notes, labs and imaging from outside physicians, which showed:  Mri of the brain 12/03/2015: personally reviewed and agree with the following.  Cerebral volume is normal. No restricted diffusion to suggest acute infarction. No midline shift, mass effect, evidence of mass lesion, ventriculomegaly, extra-axial collection or acute intracranial hemorrhage. Cervicomedullary junction and pituitary are within normal limits. Negative visualized cervical spine. Major intracranial vascular flow voids are preserved, there is mild intracranial artery tortuosity, more so in the posterior fossa.Pearline Cables and white matter signal appears essentially normal for age. There is a tiny  nonspecific focus of T2 hyperintensity in the right inferior cerebellum on series 6, image 5, which most resembles a tiny chronic lacune. No chronic cerebral blood products. No abnormal enhancement identified. Optic chiasm and cavernous sinuses appear normal. No orbit soft tissue abnormality  identified. Mastoids are clear. Paranasal sinuses are clear. Negative scalp soft tissues. Normal bone marrow signal.  CBC, CMP and TSH normal  IMPRESSION: No acute intracranial abnormality and essentially normal for age MRI appearance of the brain.  Review of Systems: Patient complains of symptoms per HPI as well as the following symptoms: weight gain, blurred vision, palpitations, SOB, snoring, increased thirst, joint pain, headache, numbness, weakness, insomnia, snoring, dizziness. Pertinent negatives per HPI. All others negative.  Social History   Social History  . Marital Status: Married    Spouse Name: [name]  . Number of Children: 1  . Years of Education: 16   Occupational History  . office manager    Social History Main Topics  . Smoking status: Never Smoker   . Smokeless tobacco: Never Used  . Alcohol Use: 0.0 oz/week    0 Standard drinks or equivalent per week     Comment: occassional  . Drug Use: No  . Sexual Activity: Not on file   Other Topics Concern  . Not on file   Social History Narrative   Lives with husband and son      Caffeine use:  1-2 cups coffee per day    Family History  Problem Relation Age of Onset  . Diabetes Mellitus II Father   . Hypertension Father   . Hypothyroidism Sister   . Cardiomyopathy Mother   . Lung cancer Paternal Grandfather   . Diabetes Sister   . Thyroid disease Sister   . Stroke Neg Hx   . Seizures Neg Hx     Past Medical History  Diagnosis Date  . Hypertension   . S/P cardiac cath 2011  . Kidney stones   . GERD (gastroesophageal reflux disease)   . Migraine   . Hypothyroidism     has resolved   . Depression   . Anxiety   . Vaginal delivery 1996    Past Surgical History  Procedure Laterality Date  . Lithotripsy  2007  . Cardiac catheterization N/A 11/01/2015    Procedure: Right Heart Cath;  Surgeon: Larey Dresser, MD;  Location: [facility];  Service: Cardiovascular;  Laterality: N/A;  .  Ureteroscopy with holmium laser lithotripsy    . Dilation and curettage of uterus  1995, 1996, 1996  . Hysteroscopy with novasure N/A 11/23/2015    Procedure: HYSTEROSCOPY WITH NOVASURE AND Emi Holes;  Surgeon: Alden Hipp, MD;  Location: [facility];  Service: Gynecology;  Laterality: N/A;    Current Outpatient Prescriptions  Medication Sig Dispense Refill  . clonazePAM (KLONOPIN) 0.5 MG tablet Take 0.5 mg by mouth at bedtime as needed (For sleep.).     Marland Kitchen ibuprofen (ADVIL,MOTRIN) 200 MG tablet Take 200 mg by mouth as directed.     . Multiple Vitamin (MULTIVITAMIN) capsule Take 1 capsule by mouth daily.    Marland Kitchen olmesartan (BENICAR) 40 MG tablet Take 20 mg by mouth daily.    . ondansetron (ZOFRAN) 4 MG tablet Take 4 mg by mouth as directed.    . valACYclovir (VALTREX) 1000 MG tablet Take 1,000 mg by mouth as directed.     . zolmitriptan (ZOMIG) 5 MG nasal solution Place 5 mg into the nose as directed.  No current facility-administered medications for this visit.   Facility-Administered Medications Ordered in Other Visits  Medication Dose Route Frequency Provider Last Rate Last Dose  . gadopentetate dimeglumine (MAGNEVIST) injection 15 mL  15 mL Intravenous Once PRN Melvenia Beam, MD        Allergies as of 04/29/2016 - Review Complete 02/07/2016  Allergen Reaction Noted  . Diovan [valsartan] Swelling   . Wellbutrin [bupropion]  12/20/2015  . Zoloft [sertraline hcl]  12/20/2015    Vitals: There were no vitals taken for this visit. Last Weight:  Wt Readings from Last 1 Encounters:  02/05/16 175 lb 8 oz (79.606 kg)   Last Height:   Ht Readings from Last 1 Encounters:  02/05/16 5\' 2"  (1.575 m)    Physical exam: Exam: Gen: NAD, conversant, well nourised, obese, well groomed  CV: RRR, no MRG. No Carotid Bruits. No peripheral edema, warm, nontender Eyes: Conjunctivae clear without exudates or hemorrhage  Neuro: Detailed Neurologic Exam  Speech:  Speech  is normal; fluent and spontaneous with normal comprehension.  Cognition:  The patient is oriented to person, place, and time;   recent and remote memory intact;   language fluent;   normal attention, concentration,   fund of knowledge Cranial Nerves:  The pupils are equal, round, and reactive to light. The fundi are flat. Visual fields are full to finger confrontation. Extraocular movements are intact. Trigeminal sensation is intact and the muscles of mastication are normal. The face is symmetric. The palate elevates in the midline. Hearing intact. Voice is normal. Shoulder shrug is normal. The tongue has normal motion without fasciculations.   Coordination:  Normal finger to nose and heel to shin. Normal rapid alternating movements.   Gait:  Heel-toe and tandem gait are normal.   Motor Observation:  No asymmetry, no atrophy, and no involuntary movements noted. Tone:  Normal muscle tone.   Posture:  Posture is normal. normal erect   Strength:  Strength is V/V in the upper and lower limbs.    Sensation: intact to LT   Reflex Exam:  DTR's:  Deep tendon reflexes in the upper and lower extremities are normal bilaterally.  Toes:  The toes are downgoing bilaterally.  Clonus:  Clonus is absent.     Assessment/Plan: 50 year old female with episodic left-sided sensory symptoms and other multiple neurologic symptoms such as internal vibrating another weird things that happened to her without etiology. Mri of the brain normal. Neuro exam non-focal. DDX includes sensory seizures, migraine aura, anxiety, lesion of the cervical cord, peripheral neuropathy all of which have been evaluated without etiology.   MRI of the cervical cord showed some foraminal narrowing but nothing significant Emg/ncs of the left arm and left leg normal EEG to eval for sensory seizures was abnormal but of unclear clinical significance Labs all  unremarkable She needs to follow up with primary care and compact neurology as needed.  Sarina Ill, MD  [facility] [address] [place], [place] [address]  Phone [phone] Fax [phone]  A total of 30 minutes minutes was spent face-to-face with this patient. Over half this time was spent on counseling patient on the multiple neurologic complaints diagnosis and different diagnostic and therapeutic options available.

## 2016-04-29 NOTE — Patient Instructions (Signed)
Remember to drink plenty of fluid, eat healthy meals and do not skip any meals. Try to eat protein with a every meal and eat a healthy snack such as fruit or nuts in between meals. Try to keep a regular sleep-wake schedule and try to exercise daily, particularly in the form of walking, 20-30 minutes a day, if you can.   As far as your medications are concerned, I would like to suggest: None at this time  Our phone number is [phone]. We also have an after hours call service for urgent matters and there is a physician on-call for urgent questions. For any emergencies you know to call 911 or go to the nearest emergency room

## 2016-07-10 DIAGNOSIS — E039 Hypothyroidism, unspecified: Secondary | ICD-10-CM | POA: Diagnosis not present

## 2016-07-10 DIAGNOSIS — R202 Paresthesia of skin: Secondary | ICD-10-CM | POA: Diagnosis not present

## 2016-07-10 DIAGNOSIS — I10 Essential (primary) hypertension: Secondary | ICD-10-CM | POA: Diagnosis not present

## 2016-08-27 DIAGNOSIS — R739 Hyperglycemia, unspecified: Secondary | ICD-10-CM | POA: Diagnosis not present

## 2016-08-27 DIAGNOSIS — Z23 Encounter for immunization: Secondary | ICD-10-CM | POA: Diagnosis not present

## 2016-08-27 DIAGNOSIS — I10 Essential (primary) hypertension: Secondary | ICD-10-CM | POA: Diagnosis not present

## 2016-09-09 DIAGNOSIS — Z1211 Encounter for screening for malignant neoplasm of colon: Secondary | ICD-10-CM | POA: Diagnosis not present

## 2016-09-09 DIAGNOSIS — E669 Obesity, unspecified: Secondary | ICD-10-CM | POA: Diagnosis not present

## 2016-09-09 DIAGNOSIS — J069 Acute upper respiratory infection, unspecified: Secondary | ICD-10-CM | POA: Diagnosis not present

## 2016-09-10 DIAGNOSIS — Z1231 Encounter for screening mammogram for malignant neoplasm of breast: Secondary | ICD-10-CM | POA: Diagnosis not present

## 2016-09-10 DIAGNOSIS — Z6832 Body mass index (BMI) 32.0-32.9, adult: Secondary | ICD-10-CM | POA: Diagnosis not present

## 2016-09-10 DIAGNOSIS — Z01419 Encounter for gynecological examination (general) (routine) without abnormal findings: Secondary | ICD-10-CM | POA: Diagnosis not present

## 2016-09-15 DIAGNOSIS — G4700 Insomnia, unspecified: Secondary | ICD-10-CM | POA: Diagnosis not present

## 2016-09-15 DIAGNOSIS — N951 Menopausal and female climacteric states: Secondary | ICD-10-CM | POA: Diagnosis not present

## 2016-09-15 DIAGNOSIS — E559 Vitamin D deficiency, unspecified: Secondary | ICD-10-CM | POA: Diagnosis not present

## 2016-09-15 DIAGNOSIS — R635 Abnormal weight gain: Secondary | ICD-10-CM | POA: Diagnosis not present

## 2016-09-17 DIAGNOSIS — B029 Zoster without complications: Secondary | ICD-10-CM | POA: Diagnosis not present

## 2016-09-29 DIAGNOSIS — R3915 Urgency of urination: Secondary | ICD-10-CM | POA: Diagnosis not present

## 2016-09-29 DIAGNOSIS — I10 Essential (primary) hypertension: Secondary | ICD-10-CM | POA: Diagnosis not present

## 2016-09-29 DIAGNOSIS — K7689 Other specified diseases of liver: Secondary | ICD-10-CM | POA: Diagnosis not present

## 2016-09-29 DIAGNOSIS — R739 Hyperglycemia, unspecified: Secondary | ICD-10-CM | POA: Diagnosis not present

## 2016-09-29 DIAGNOSIS — E039 Hypothyroidism, unspecified: Secondary | ICD-10-CM | POA: Diagnosis not present

## 2016-09-29 DIAGNOSIS — M549 Dorsalgia, unspecified: Secondary | ICD-10-CM | POA: Diagnosis not present

## 2016-09-29 DIAGNOSIS — M545 Low back pain: Secondary | ICD-10-CM | POA: Diagnosis not present

## 2016-09-30 ENCOUNTER — Other Ambulatory Visit: Payer: Self-pay | Admitting: Internal Medicine

## 2016-09-30 DIAGNOSIS — M545 Low back pain: Secondary | ICD-10-CM

## 2016-09-30 DIAGNOSIS — R109 Unspecified abdominal pain: Secondary | ICD-10-CM

## 2016-10-01 ENCOUNTER — Ambulatory Visit
Admission: RE | Admit: 2016-10-01 | Discharge: 2016-10-01 | Disposition: A | Discharge status: Home / Self Care | Payer: BLUE CROSS/BLUE SHIELD | Source: Ambulatory Visit | Attending: Internal Medicine | Admitting: Internal Medicine

## 2016-10-01 DIAGNOSIS — R109 Unspecified abdominal pain: Secondary | ICD-10-CM | POA: Diagnosis not present

## 2016-10-01 DIAGNOSIS — M545 Low back pain: Secondary | ICD-10-CM

## 2016-11-11 ENCOUNTER — Telehealth: Payer: Self-pay | Admitting: Neurology

## 2016-11-11 NOTE — Telephone Encounter (Signed)
No this can wait until tomorrow thanks

## 2016-11-11 NOTE — Telephone Encounter (Signed)
Noted, thank you

## 2016-11-11 NOTE — Telephone Encounter (Signed)
Patient reports new tingling bil arms that will get hot or cold. She is also having twitching on the face around the eye-rt>lt. An appointment was made for 11/29 @ 2:30.  Advised that the nurse would call if there was any other questions.

## 2016-11-11 NOTE — Telephone Encounter (Signed)
Dr Jaynee Eagles- would you like me to call patient for anything else? She is coming in tomorrow to see you

## 2016-11-12 ENCOUNTER — Ambulatory Visit (INDEPENDENT_AMBULATORY_CARE_PROVIDER_SITE_OTHER): Payer: BLUE CROSS/BLUE SHIELD | Admitting: Neurology

## 2016-11-12 ENCOUNTER — Encounter: Payer: Self-pay | Admitting: Neurology

## 2016-11-12 VITALS — BP 120/81 | HR 86 | Ht 62.0 in | Wt 180.0 lb

## 2016-11-12 DIAGNOSIS — R253 Fasciculation: Secondary | ICD-10-CM | POA: Diagnosis not present

## 2016-11-12 MED ORDER — LORAZEPAM 0.5 MG PO TABS
0.5000 mg | ORAL_TABLET | Freq: Every day | ORAL | 0 refills | Status: AC
Start: 2016-11-12 — End: ?

## 2016-11-12 NOTE — Patient Instructions (Addendum)
Remember to drink plenty of fluid, eat healthy meals and do not skip any meals. Try to eat protein with a every meal and eat a healthy snack such as fruit or nuts in between meals. Try to keep a regular sleep-wake schedule and try to exercise daily, particularly in the form of walking, 20-30 minutes a day, if you can.   As far as your medications are concerned, I would like to suggest; Lorazepam at night for a few weeks.  Benign Fasciculation syndrome  Our phone number is [phone]. We also have an after hours call service for urgent matters and there is a physician on-call for urgent questions. For any emergencies you know to call 911 or go to the nearest emergency room   Lorazepam tablets What is this medicine? LORAZEPAM (lor A ze pam) is a benzodiazepine. It is used to treat anxiety. This medicine may be used for other purposes; ask your health care provider or pharmacist if you have questions. COMMON BRAND NAME(S): Ativan What should I tell my health care provider before I take this medicine? They need to know if you have any of these conditions: -glaucoma -history of drug or alcohol abuse problem -kidney disease -liver disease -lung or breathing disease, like asthma -mental illness -myasthenia gravis -Parkinson's disease -suicidal thoughts, plans, or attempt; a previous suicide attempt by you or a family member -an unusual or allergic reaction to lorazepam, other medicines, foods, dyes, or preservatives -pregnant or trying to get pregnant -breast-feeding How should I use this medicine? Take this medicine by mouth with a glass of water. Follow the directions on the prescription label. Take your medicine at regular intervals. Do not take it more often than directed. Do not stop taking except on your doctor's advice. A special MedGuide will be given to you by the pharmacist with each prescription and refill. Be sure to read this information carefully each time. Talk to your  pediatrician regarding the use of this medicine in children. While this drug may be used in children as young as 12 years for selected conditions, precautions do apply. Overdosage: If you think you have taken too much of this medicine contact a poison control center or emergency room at once. NOTE: This medicine is only for you. Do not share this medicine with others. What if I miss a dose? If you miss a dose, take it as soon as you can. If it is almost time for your next dose, take only that dose. Do not take double or extra doses. What may interact with this medicine? Do not take this medicine with any of the following medications: -narcotic medicines for cough -sodium oxybate This medicine may also interact with the following medications: -alcohol -antihistamines for allergy, cough and cold -certain medicines for anxiety or sleep -certain medicines for depression, like amitriptyline, fluoxetine, sertraline -certain medicines for seizures like carbamazepine, phenobarbital, phenytoin, primidone -general anesthetics like lidocaine, pramoxine, tetracaine -MAOIs like Carbex, Eldepryl, Marplan, Nardil, and Parnate -medicines that relax muscles for surgery -narcotic medicines for pain -phenothiazines like chlorpromazine, mesoridazine, prochlorperazine, thioridazine This list may not describe all possible interactions. Give your health care provider a list of all the medicines, herbs, non-prescription drugs, or dietary supplements you use. Also tell them if you smoke, drink alcohol, or use illegal drugs. Some items may interact with your medicine. What should I watch for while using this medicine? Tell your doctor or health care professional if your symptoms do not start to get better or if they get worse.  Do not stop taking except on your doctor's advice. You may develop a severe reaction. Your doctor will tell you how much medicine to take. You may get drowsy or dizzy. Do not drive, use  machinery, or do anything that needs mental alertness until you know how this medicine affects you. To reduce the risk of dizzy and fainting spells, do not stand or sit up quickly, especially if you are an older patient. Alcohol may increase dizziness and drowsiness. Avoid alcoholic drinks. If you are taking another medicine that also causes drowsiness, you may have more side effects. Give your health care provider a list of all medicines you use. Your doctor will tell you how much medicine to take. Do not take more medicine than directed. Call emergency for help if you have problems breathing or unusual sleepiness. What side effects may I notice from receiving this medicine? Side effects that you should report to your doctor or health care professional as soon as possible: -allergic reactions like skin rash, itching or hives, swelling of the face, lips, or tongue -breathing problems -confusion -loss of balance or coordination -signs and symptoms of low blood pressure like dizziness; feeling faint or lightheaded, falls; unusually weak or tired -suicidal thoughts or other mood changes Side effects that usually do not require medical attention (report to your doctor or health care professional if they continue or are bothersome): -dizziness -headache -nausea, vomiting -tiredness This list may not describe all possible side effects. Call your doctor for medical advice about side effects. You may report side effects to FDA at 1-800-FDA-1088. Where should I keep my medicine? Keep out of the reach of children. This medicine can be abused. Keep your medicine in a safe place to protect it from theft. Do not share this medicine with anyone. Selling or giving away this medicine is dangerous and against the law. This medicine may cause accidental overdose and death if taken by other adults, children, or pets. Mix any unused medicine with a substance like cat litter or coffee grounds. Then throw the medicine  away in a sealed container like a sealed bag or a coffee can with a lid. Do not use the medicine after the expiration date. Store at room temperature between 20 and 25 degrees C (68 and 77 degrees F). Protect from light. Keep container tightly closed. NOTE: This sheet is a summary. It may not cover all possible information. If you have questions about this medicine, talk to your doctor, pharmacist, or health care provider.  2017 Elsevier/Gold Standard (2015-08-30 15:54:27)

## 2016-11-12 NOTE — Progress Notes (Signed)
[facility]   Provider:  Dr Jaynee Eagles Referring Provider: [name], MD Primary Care Physician:  [name], MD  CC: Left-sided sensory symptoms, vibrations all over her body, body twitching and multiple other changing neurologic symptoms. Today body twitches in the legs and face. Maybe benign fasciculations.  Interval history 11/12/2016; Patient returns for new neurologic problems. Initially it was left arm sensory symptoms 12/27/2015 which resolved , then returned in May with vibrating-type sensory symptoms in the body, today she reports continued vibratory symptoms that wake her up in the night with tingling feeling all over and coldness and hot sensations, but her new problem today is her eyes start twitching when she wakes up and during the day mainly the left and the right side, she has a "weird sensation in her head", she gets wavy feeling inside of her head just has been ongoing in the last week and yesterday was the worst day but most times she could feel the twitching and not see it but when she would try and catch it maybe she would see it. Yesterday was the worse day, she started drinking water and it was not as bad last night. She snores. She had a sleep study years ago 7 years ago which was normal she had it due to shortness of breath and she was not sleeping. She also has twitching in her muscles. She can sometimes hear her husband but she can;t understand what he says. She feels twitches and zaps all over her body.    Interval history 04/29/2016:   She returns today for different symptoms. Her left-sided symptoms have resolved and now she has new symptoms. Her thumb sometimes moves back and forth. Electrical zaps in the legs and the feet. Mostly on the left but also on the right. She has had a few "weird things" happen. She has woken up at 2am or 3am and she feels like she is vibrating. Feels it in her head. And in her chest. If she breathes it goes away in 10 minutes.  It wakes her up. Vibration mostly in her chest. The other issues in her left side have gone away. She has gained 20 pounds. She has weaknes in the left leg still. There is white floting objects in her urine. She has gained urine. Jojnts sore and stiff. Discussed EEG and MRI of the cervical spine. She also gets dizzy when she puts her down and out.  MRI of the cervical spine 01/03/2016:  Abnormal MRI cervical spine (with and without) demonstrating: 1. At C3-4: disc bulging, uncovertebral joint hypertrophy and facet hypertrophy with moderate left foraminal stenosis 2. At C6-7: uncovertebral joint hypertrophy and facet hypertrophy with moderate left foraminal stenosis    EEG 01/22/2016: This is an abnormal EEG recording due to episodic bursts of theta frequency slowing that are generalized in nature. There are no clear epileptiform discharges, but this study suggests mild bihemispheric dysfunction, or some dysfunction of the the midline nuclei. The episodic slowing events are paroxysmal.  EMG/NCS 01/2016: This is a normal study. No electrophysiologic evidence for ulnar or median neuropathy, peripheral polyneuropathy, cervical or lumbar radiculopathy, muscle disorder.   HPI: Shelley Zimmerman is a 50 y.o. female here as a referral from Dr. Maudie Mercury for left-sided sensory symptoms. PMHx of HTN, catheterization, migraine. Started the 4th or 5th of December. She started taking Zoloft a few days before and she also had heart cath and ablation. The pain starts on the top of her head to the base  of the neck down both arms like heat. Then tingling and numbness on the left side of the arm. The tingling radiates down the medial arm to the 5th digit and down the leg as well. She has numbness on the left arm and left leg, the muscle feels rigid. She would wake up with a pounding heart and her blood pressure was spiking up to 150-160/130 and would be associated with the symptoms. Left side of the face tingling. Would  continue for several hours, at least 3. Vision blurry. Frequency increased from just at night to multiple times a day. 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 It all started after a heart catheterization and ablation and around the time she started taking zoloft with a lot of stress. Vision has continuously been blurry.   Reviewed notes, labs and imaging from outside physicians, which showed:  Mri of the brain 12/03/2015: personally reviewed and agree with the following.  Cerebral volume is normal. No restricted diffusion to suggest acute infarction. No midline shift, mass effect, evidence of mass lesion, ventriculomegaly, extra-axial collection or acute intracranial hemorrhage. Cervicomedullary junction and pituitary are within normal limits. Negative visualized cervical spine. Major intracranial vascular flow voids are preserved, there is mild intracranial artery tortuosity, more so in the posterior fossa.Pearline Cables and white matter signal appears essentially normal for age. There is a tiny nonspecific focus of T2 hyperintensity in the right inferior cerebellum on series 6, image 5, which most resembles a tiny chronic lacune. No chronic cerebral blood products. No abnormal enhancement identified. Optic chiasm and cavernous sinuses appear normal. No orbit soft tissue abnormality identified. Mastoids are clear. Paranasal sinuses are clear. Negative scalp soft tissues. Normal bone marrow signal.  CBC, CMP and TSH normal  IMPRESSION: No acute intracranial abnormality and essentially normal for age MRI appearance of the brain.  Review of Systems: Patient complains of symptoms per HPI as well as the following symptoms: weight gain, blurred vision, palpitations, SOB, snoring, increased thirst, joint pain, headache, numbness, weakness, insomnia, snoring, dizziness. Pertinent negatives per HPI. All others negative.   Social History   Social History  . Marital status: Married    Spouse name:  [name]  . Number of children: 1  . Years of education: 60   Occupational History  . office manager    Social History Main Topics  . Smoking status: Never Smoker  . Smokeless tobacco: Never Used  . Alcohol use 0.0 oz/week     Comment: occassional  . Drug use: No  . Sexual activity: Not on file   Other Topics Concern  . Not on file   Social History Narrative   Lives with husband and son      Caffeine use:  1-2 cups coffee per day    Family History  Problem Relation Age of Onset  . Diabetes Mellitus II Father   . Hypertension Father   . Hypothyroidism Sister   . Cardiomyopathy Mother   . Lung cancer Paternal Grandfather   . Diabetes Sister   . Thyroid disease Sister   . Stroke Neg Hx   . Seizures Neg Hx     Past Medical History:  Diagnosis Date  . Anxiety   . Depression   . GERD (gastroesophageal reflux disease)   . Hypertension   . Hypothyroidism    has resolved   . Kidney stones   . Migraine   . S/P cardiac cath 2011  . Vaginal delivery 1996    Past Surgical History:  Procedure Laterality Date  . CARDIAC CATHETERIZATION N/A 11/01/2015   Procedure: Right Heart Cath;  Surgeon: Larey Dresser, MD;  Location: [facility];  Service: Cardiovascular;  Laterality: N/A;  . [place]  . HYSTEROSCOPY WITH NOVASURE N/A 11/23/2015   Procedure: HYSTEROSCOPY WITH NOVASURE AND Emi Holes;  Surgeon: Alden Hipp, MD;  Location: [facility];  Service: Gynecology;  Laterality: N/A;  . LITHOTRIPSY  2007  . URETEROSCOPY WITH HOLMIUM LASER LITHOTRIPSY      Current Outpatient Prescriptions  Medication Sig Dispense Refill  . Cholecalciferol (VITAMIN D PO) Take 50,000 Units by mouth once a week.    . Multiple Vitamin (MULTIVITAMIN) capsule Take 1 capsule by mouth daily. Reported on 04/29/2016    . olmesartan (BENICAR) 40 MG tablet Take 20 mg by mouth daily.    . Progesterone Micronized (PROGESTERONE PO) Take 50 mg by mouth daily.      Marland Kitchen UNABLE TO FIND 1 Dose 3 (three) times a week. Med Name: Testosterone- estradiol - low dose    . valACYclovir (VALTREX) 1000 MG tablet Take 1,000 mg by mouth as needed.      No current facility-administered medications for this visit.    Facility-Administered Medications Ordered in Other Visits  Medication Dose Route Frequency Provider Last Rate Last Dose  . gadopentetate dimeglumine (MAGNEVIST) injection 15 mL  15 mL Intravenous Once PRN Melvenia Beam, MD        Allergies as of 11/12/2016 - Review Complete 11/12/2016  Allergen Reaction Noted  . Diovan [valsartan] Swelling   . Wellbutrin [bupropion]  12/20/2015  . Zoloft [sertraline hcl]  12/20/2015    Vitals: BP 120/81 (BP Location: Right Arm, Patient Position: Sitting, Cuff Size: Normal)   Pulse 86   Ht 5\' 2"  (1.575 m)   Wt 180 lb (81.6 kg)   BMI 32.92 kg/m  Last Weight:  Wt Readings from Last 1 Encounters:  11/12/16 180 lb (81.6 kg)   Last Height:   Ht Readings from Last 1 Encounters:  11/12/16 5\' 2"  (1.575 m)     Physical exam: Exam: Gen: NAD, conversant, well nourised, obese, well groomed  CV: RRR, no MRG. No Carotid Bruits. No peripheral edema, warm, nontender Eyes: Conjunctivae clear without exudates or hemorrhage  Neuro: Detailed Neurologic Exam  Speech:  Speech is normal; fluent and spontaneous with normal comprehension.  Cognition:  The patient is oriented to person, place, and time;   recent and remote memory intact;   language fluent;   normal attention, concentration,   fund of knowledge Cranial Nerves:  The pupils are equal, round, and reactive to light. The fundi are flat. Visual fields are full to finger confrontation. Extraocular movements are intact. Trigeminal sensation is intact and the muscles of mastication are normal. The face is symmetric. The palate elevates in the midline. Hearing intact. Voice is normal. Shoulder shrug is normal. The tongue  has normal motion without fasciculations.   Coordination:  Normal finger to nose and heel to shin. Normal rapid alternating movements.   Gait:  Heel-toe and tandem gait are normal.   Motor Observation:  No asymmetry, no atrophy, and no involuntary movements noted. Tone:  Normal muscle tone.   Posture:  Posture is normal. normal erect   Strength:  Strength is V/V in the upper and lower limbs.    Sensation: intact to LT   Reflex Exam:  DTR's:  Deep tendon reflexes in the upper and lower extremities are  normal bilaterally.  Toes:  The toes are downgoing bilaterally.  Clonus:  Clonus is absent.     Assessment/Plan: 50 year old female with multiple neurologic symptoms such as internal vibrating, zaps all over her body and many "weird things" that happen to her, no etiology found to date despite extensive workup. Mri of the brain normal. Neuro exam non-focal. DDX includes sensory seizures, migraine aura, lesion of the cervical cord(mri normal), peripheral neuropathy all of which have been evaluated without etiology.   MRI of the cervical cord showed some foraminal narrowing but nothing significant, mri brain normal Emg/ncs of the left arm and left leg normal EEG to eval for sensory seizures was slightly abnormal but the findings were of unclear clinical significance( episodic bursts of theta frequency slowing that are generalized in nature. There are no clear epileptiform discharges, but this study suggests mild bihemispheric dysfunction, or some dysfunction of the the midline nuclei. The episodic slowing events are paroxysmal.). She declines repeat.  Labs all unremarkable As far as your medications are concerned, I would like to suggest; Lorazepam at night for a few weeks. Can try propanolol at night too for her body muscle twitches, still waking up with "vibrations all over body" Benign Fasciculation syndrome   She needs to follow  up with primary care and neurology as needed.  Dr. Quincy Sheehan, MD  [facility] [address] [place], [place] [address]  Phone [phone] Fax [phone]  A total of 30 minutes minutes was spent face-to-face with this patient. Over half this time was spent on counseling patient on the multiple neurologic complaints diagnosis and different diagnostic and therapeutic options available.

## 2016-11-13 ENCOUNTER — Telehealth: Payer: Self-pay | Admitting: *Deleted

## 2016-11-13 LAB — BASIC METABOLIC PANEL
BUN / CREAT RATIO: 16 (ref 9–23)
BUN: 11 mg/dL (ref 6–24)
CO2: 25 mmol/L (ref 18–29)
CREATININE: 0.7 mg/dL (ref 0.57–1.00)
Calcium: 9.4 mg/dL (ref 8.7–10.2)
Chloride: 100 mmol/L (ref 96–106)
GFR calc Af Amer: 117 mL/min/{1.73_m2} (ref >59)
GFR, EST NON AFRICAN AMERICAN: 101 mL/min/{1.73_m2} (ref >59)
GLUCOSE: 104 mg/dL — AB (ref 65–99)
Potassium: 4.2 mmol/L (ref 3.5–5.2)
Sodium: 141 mmol/L (ref 134–144)

## 2016-11-13 LAB — TSH: TSH: 1.51 u[IU]/mL (ref 0.450–4.500)

## 2016-11-13 LAB — MAGNESIUM: Magnesium: 2.4 mg/dL — ABNORMAL HIGH (ref 1.6–2.3)

## 2016-11-13 NOTE — Telephone Encounter (Signed)
Called and spoke to pt about lab results per AA,MD note. Pt verbalized understanding and has no further questions at this time.

## 2016-11-13 NOTE — Telephone Encounter (Signed)
-----   Message from Melvenia Beam, MD sent at 11/13/2016 10:57 AM EST ----- Labs look fine thanks

## 2016-12-04 DIAGNOSIS — N951 Menopausal and female climacteric states: Secondary | ICD-10-CM | POA: Diagnosis not present

## 2016-12-04 DIAGNOSIS — G4700 Insomnia, unspecified: Secondary | ICD-10-CM | POA: Diagnosis not present

## 2016-12-29 DIAGNOSIS — Z0001 Encounter for general adult medical examination with abnormal findings: Secondary | ICD-10-CM | POA: Diagnosis not present

## 2016-12-29 DIAGNOSIS — R739 Hyperglycemia, unspecified: Secondary | ICD-10-CM | POA: Diagnosis not present

## 2016-12-29 DIAGNOSIS — M109 Gout, unspecified: Secondary | ICD-10-CM | POA: Diagnosis not present

## 2017-01-05 DIAGNOSIS — R739 Hyperglycemia, unspecified: Secondary | ICD-10-CM | POA: Diagnosis not present

## 2017-01-05 DIAGNOSIS — E039 Hypothyroidism, unspecified: Secondary | ICD-10-CM | POA: Diagnosis not present

## 2017-01-05 DIAGNOSIS — I10 Essential (primary) hypertension: Secondary | ICD-10-CM | POA: Diagnosis not present

## 2017-02-18 DIAGNOSIS — H04123 Dry eye syndrome of bilateral lacrimal glands: Secondary | ICD-10-CM | POA: Diagnosis not present

## 2017-03-31 DIAGNOSIS — N952 Postmenopausal atrophic vaginitis: Secondary | ICD-10-CM | POA: Diagnosis not present

## 2017-03-31 DIAGNOSIS — Z712 Person consulting for explanation of examination or test findings: Secondary | ICD-10-CM | POA: Diagnosis not present

## 2017-03-31 DIAGNOSIS — N951 Menopausal and female climacteric states: Secondary | ICD-10-CM | POA: Diagnosis not present

## 2017-04-22 DIAGNOSIS — H5203 Hypermetropia, bilateral: Secondary | ICD-10-CM | POA: Diagnosis not present

## 2017-05-14 DIAGNOSIS — M50221 Other cervical disc displacement at C4-C5 level: Secondary | ICD-10-CM | POA: Diagnosis not present

## 2017-05-14 DIAGNOSIS — M542 Cervicalgia: Secondary | ICD-10-CM | POA: Diagnosis not present

## 2017-05-14 DIAGNOSIS — M791 Myalgia: Secondary | ICD-10-CM | POA: Diagnosis not present

## 2017-05-14 DIAGNOSIS — M50321 Other cervical disc degeneration at C4-C5 level: Secondary | ICD-10-CM | POA: Diagnosis not present

## 2017-05-14 DIAGNOSIS — R200 Anesthesia of skin: Secondary | ICD-10-CM | POA: Diagnosis not present

## 2017-05-15 DIAGNOSIS — M542 Cervicalgia: Secondary | ICD-10-CM | POA: Diagnosis not present

## 2017-05-15 DIAGNOSIS — M50321 Other cervical disc degeneration at C4-C5 level: Secondary | ICD-10-CM | POA: Diagnosis not present

## 2017-05-15 DIAGNOSIS — M791 Myalgia: Secondary | ICD-10-CM | POA: Diagnosis not present

## 2017-05-15 DIAGNOSIS — M50221 Other cervical disc displacement at C4-C5 level: Secondary | ICD-10-CM | POA: Diagnosis not present

## 2017-05-15 DIAGNOSIS — R200 Anesthesia of skin: Secondary | ICD-10-CM | POA: Diagnosis not present

## 2017-05-18 DIAGNOSIS — R202 Paresthesia of skin: Secondary | ICD-10-CM | POA: Diagnosis not present

## 2017-05-18 DIAGNOSIS — M4722 Other spondylosis with radiculopathy, cervical region: Secondary | ICD-10-CM | POA: Diagnosis not present

## 2017-05-20 DIAGNOSIS — M791 Myalgia: Secondary | ICD-10-CM | POA: Diagnosis not present

## 2017-05-20 DIAGNOSIS — M542 Cervicalgia: Secondary | ICD-10-CM | POA: Diagnosis not present

## 2017-05-20 DIAGNOSIS — R200 Anesthesia of skin: Secondary | ICD-10-CM | POA: Diagnosis not present

## 2017-05-20 DIAGNOSIS — M50321 Other cervical disc degeneration at C4-C5 level: Secondary | ICD-10-CM | POA: Diagnosis not present

## 2017-05-20 DIAGNOSIS — M50221 Other cervical disc displacement at C4-C5 level: Secondary | ICD-10-CM | POA: Diagnosis not present

## 2017-05-22 DIAGNOSIS — M791 Myalgia: Secondary | ICD-10-CM | POA: Diagnosis not present

## 2017-05-22 DIAGNOSIS — M542 Cervicalgia: Secondary | ICD-10-CM | POA: Diagnosis not present

## 2017-05-22 DIAGNOSIS — R200 Anesthesia of skin: Secondary | ICD-10-CM | POA: Diagnosis not present

## 2017-05-22 DIAGNOSIS — M50221 Other cervical disc displacement at C4-C5 level: Secondary | ICD-10-CM | POA: Diagnosis not present

## 2017-05-22 DIAGNOSIS — M50321 Other cervical disc degeneration at C4-C5 level: Secondary | ICD-10-CM | POA: Diagnosis not present

## 2017-05-25 DIAGNOSIS — M50221 Other cervical disc displacement at C4-C5 level: Secondary | ICD-10-CM | POA: Diagnosis not present

## 2017-05-25 DIAGNOSIS — M542 Cervicalgia: Secondary | ICD-10-CM | POA: Diagnosis not present

## 2017-05-25 DIAGNOSIS — M50321 Other cervical disc degeneration at C4-C5 level: Secondary | ICD-10-CM | POA: Diagnosis not present

## 2017-05-25 DIAGNOSIS — R200 Anesthesia of skin: Secondary | ICD-10-CM | POA: Diagnosis not present

## 2017-05-25 DIAGNOSIS — M791 Myalgia: Secondary | ICD-10-CM | POA: Diagnosis not present

## 2017-05-27 DIAGNOSIS — M791 Myalgia: Secondary | ICD-10-CM | POA: Diagnosis not present

## 2017-05-27 DIAGNOSIS — R200 Anesthesia of skin: Secondary | ICD-10-CM | POA: Diagnosis not present

## 2017-05-27 DIAGNOSIS — M542 Cervicalgia: Secondary | ICD-10-CM | POA: Diagnosis not present

## 2017-05-27 DIAGNOSIS — M50221 Other cervical disc displacement at C4-C5 level: Secondary | ICD-10-CM | POA: Diagnosis not present

## 2017-05-27 DIAGNOSIS — M50321 Other cervical disc degeneration at C4-C5 level: Secondary | ICD-10-CM | POA: Diagnosis not present

## 2017-06-01 DIAGNOSIS — N951 Menopausal and female climacteric states: Secondary | ICD-10-CM | POA: Diagnosis not present

## 2017-06-01 DIAGNOSIS — Z79890 Hormone replacement therapy (postmenopausal): Secondary | ICD-10-CM | POA: Diagnosis not present

## 2017-06-01 DIAGNOSIS — R200 Anesthesia of skin: Secondary | ICD-10-CM | POA: Diagnosis not present

## 2017-06-01 DIAGNOSIS — N952 Postmenopausal atrophic vaginitis: Secondary | ICD-10-CM | POA: Diagnosis not present

## 2017-06-01 DIAGNOSIS — M542 Cervicalgia: Secondary | ICD-10-CM | POA: Diagnosis not present

## 2017-06-01 DIAGNOSIS — G4700 Insomnia, unspecified: Secondary | ICD-10-CM | POA: Diagnosis not present

## 2017-06-01 DIAGNOSIS — M791 Myalgia: Secondary | ICD-10-CM | POA: Diagnosis not present

## 2017-06-01 DIAGNOSIS — M50221 Other cervical disc displacement at C4-C5 level: Secondary | ICD-10-CM | POA: Diagnosis not present

## 2017-06-01 DIAGNOSIS — M50321 Other cervical disc degeneration at C4-C5 level: Secondary | ICD-10-CM | POA: Diagnosis not present

## 2017-06-01 DIAGNOSIS — E559 Vitamin D deficiency, unspecified: Secondary | ICD-10-CM | POA: Diagnosis not present

## 2017-06-02 DIAGNOSIS — M50221 Other cervical disc displacement at C4-C5 level: Secondary | ICD-10-CM | POA: Diagnosis not present

## 2017-06-02 DIAGNOSIS — R200 Anesthesia of skin: Secondary | ICD-10-CM | POA: Diagnosis not present

## 2017-06-02 DIAGNOSIS — M542 Cervicalgia: Secondary | ICD-10-CM | POA: Diagnosis not present

## 2017-06-02 DIAGNOSIS — M50321 Other cervical disc degeneration at C4-C5 level: Secondary | ICD-10-CM | POA: Diagnosis not present

## 2017-06-02 DIAGNOSIS — M791 Myalgia: Secondary | ICD-10-CM | POA: Diagnosis not present

## 2017-06-05 DIAGNOSIS — R200 Anesthesia of skin: Secondary | ICD-10-CM | POA: Diagnosis not present

## 2017-06-05 DIAGNOSIS — M50221 Other cervical disc displacement at C4-C5 level: Secondary | ICD-10-CM | POA: Diagnosis not present

## 2017-06-05 DIAGNOSIS — M50321 Other cervical disc degeneration at C4-C5 level: Secondary | ICD-10-CM | POA: Diagnosis not present

## 2017-06-05 DIAGNOSIS — M791 Myalgia: Secondary | ICD-10-CM | POA: Diagnosis not present

## 2017-06-08 DIAGNOSIS — M791 Myalgia: Secondary | ICD-10-CM | POA: Diagnosis not present

## 2017-06-08 DIAGNOSIS — R200 Anesthesia of skin: Secondary | ICD-10-CM | POA: Diagnosis not present

## 2017-06-08 DIAGNOSIS — M50221 Other cervical disc displacement at C4-C5 level: Secondary | ICD-10-CM | POA: Diagnosis not present

## 2017-06-08 DIAGNOSIS — M50321 Other cervical disc degeneration at C4-C5 level: Secondary | ICD-10-CM | POA: Diagnosis not present

## 2017-06-10 DIAGNOSIS — R200 Anesthesia of skin: Secondary | ICD-10-CM | POA: Diagnosis not present

## 2017-06-10 DIAGNOSIS — M791 Myalgia: Secondary | ICD-10-CM | POA: Diagnosis not present

## 2017-06-10 DIAGNOSIS — M50221 Other cervical disc displacement at C4-C5 level: Secondary | ICD-10-CM | POA: Diagnosis not present

## 2017-06-10 DIAGNOSIS — M50321 Other cervical disc degeneration at C4-C5 level: Secondary | ICD-10-CM | POA: Diagnosis not present

## 2017-06-29 DIAGNOSIS — E039 Hypothyroidism, unspecified: Secondary | ICD-10-CM | POA: Diagnosis not present

## 2017-06-29 DIAGNOSIS — I10 Essential (primary) hypertension: Secondary | ICD-10-CM | POA: Diagnosis not present

## 2017-06-29 DIAGNOSIS — R739 Hyperglycemia, unspecified: Secondary | ICD-10-CM | POA: Diagnosis not present

## 2017-07-06 DIAGNOSIS — R739 Hyperglycemia, unspecified: Secondary | ICD-10-CM | POA: Diagnosis not present

## 2017-07-06 DIAGNOSIS — I10 Essential (primary) hypertension: Secondary | ICD-10-CM | POA: Diagnosis not present

## 2017-08-12 DIAGNOSIS — F330 Major depressive disorder, recurrent, mild: Secondary | ICD-10-CM | POA: Diagnosis not present

## 2017-09-14 DIAGNOSIS — F330 Major depressive disorder, recurrent, mild: Secondary | ICD-10-CM | POA: Diagnosis not present

## 2017-10-20 DIAGNOSIS — N951 Menopausal and female climacteric states: Secondary | ICD-10-CM | POA: Diagnosis not present

## 2017-10-20 DIAGNOSIS — R635 Abnormal weight gain: Secondary | ICD-10-CM | POA: Diagnosis not present

## 2017-10-21 DIAGNOSIS — R232 Flushing: Secondary | ICD-10-CM | POA: Diagnosis not present

## 2017-10-21 DIAGNOSIS — E669 Obesity, unspecified: Secondary | ICD-10-CM | POA: Diagnosis not present

## 2017-10-21 DIAGNOSIS — I10 Essential (primary) hypertension: Secondary | ICD-10-CM | POA: Diagnosis not present

## 2017-10-21 DIAGNOSIS — R5383 Other fatigue: Secondary | ICD-10-CM | POA: Diagnosis not present

## 2017-10-27 DIAGNOSIS — E669 Obesity, unspecified: Secondary | ICD-10-CM | POA: Diagnosis not present

## 2017-10-27 DIAGNOSIS — Z6832 Body mass index (BMI) 32.0-32.9, adult: Secondary | ICD-10-CM | POA: Diagnosis not present

## 2017-10-27 DIAGNOSIS — E039 Hypothyroidism, unspecified: Secondary | ICD-10-CM | POA: Diagnosis not present

## 2017-10-27 DIAGNOSIS — I10 Essential (primary) hypertension: Secondary | ICD-10-CM | POA: Diagnosis not present

## 2017-10-27 DIAGNOSIS — Z1231 Encounter for screening mammogram for malignant neoplasm of breast: Secondary | ICD-10-CM | POA: Diagnosis not present

## 2017-10-27 DIAGNOSIS — R7303 Prediabetes: Secondary | ICD-10-CM | POA: Diagnosis not present

## 2017-10-28 ENCOUNTER — Other Ambulatory Visit: Payer: Self-pay | Admitting: Obstetrics & Gynecology

## 2017-10-28 DIAGNOSIS — R928 Other abnormal and inconclusive findings on diagnostic imaging of breast: Secondary | ICD-10-CM

## 2017-11-04 ENCOUNTER — Other Ambulatory Visit: Payer: Self-pay | Admitting: Obstetrics & Gynecology

## 2017-11-04 ENCOUNTER — Ambulatory Visit
Admission: RE | Admit: 2017-11-04 | Discharge: 2017-11-04 | Disposition: A | Discharge status: Home / Self Care | Payer: BLUE CROSS/BLUE SHIELD | Source: Ambulatory Visit | Attending: Obstetrics & Gynecology | Admitting: Obstetrics & Gynecology

## 2017-11-04 DIAGNOSIS — R928 Other abnormal and inconclusive findings on diagnostic imaging of breast: Secondary | ICD-10-CM

## 2017-11-04 DIAGNOSIS — N6310 Unspecified lump in the right breast, unspecified quadrant: Secondary | ICD-10-CM

## 2017-11-04 DIAGNOSIS — N6011 Diffuse cystic mastopathy of right breast: Secondary | ICD-10-CM | POA: Diagnosis not present

## 2017-11-11 DIAGNOSIS — E669 Obesity, unspecified: Secondary | ICD-10-CM | POA: Diagnosis not present

## 2017-11-11 DIAGNOSIS — I10 Essential (primary) hypertension: Secondary | ICD-10-CM | POA: Diagnosis not present

## 2017-11-11 DIAGNOSIS — R7303 Prediabetes: Secondary | ICD-10-CM | POA: Diagnosis not present

## 2017-11-12 DIAGNOSIS — F330 Major depressive disorder, recurrent, mild: Secondary | ICD-10-CM | POA: Diagnosis not present

## 2017-11-17 DIAGNOSIS — R05 Cough: Secondary | ICD-10-CM | POA: Diagnosis not present

## 2017-11-17 DIAGNOSIS — J411 Mucopurulent chronic bronchitis: Secondary | ICD-10-CM | POA: Diagnosis not present

## 2017-11-18 DIAGNOSIS — R5383 Other fatigue: Secondary | ICD-10-CM | POA: Diagnosis not present

## 2017-11-18 DIAGNOSIS — M2550 Pain in unspecified joint: Secondary | ICD-10-CM | POA: Diagnosis not present

## 2017-11-18 DIAGNOSIS — R6882 Decreased libido: Secondary | ICD-10-CM | POA: Diagnosis not present

## 2017-11-18 DIAGNOSIS — R232 Flushing: Secondary | ICD-10-CM | POA: Diagnosis not present

## 2017-11-26 DIAGNOSIS — R05 Cough: Secondary | ICD-10-CM | POA: Diagnosis not present

## 2017-11-26 DIAGNOSIS — J45909 Unspecified asthma, uncomplicated: Secondary | ICD-10-CM | POA: Diagnosis not present

## 2017-11-26 DIAGNOSIS — Z23 Encounter for immunization: Secondary | ICD-10-CM | POA: Diagnosis not present

## 2017-11-26 DIAGNOSIS — K1379 Other lesions of oral mucosa: Secondary | ICD-10-CM | POA: Diagnosis not present

## 2017-11-27 DIAGNOSIS — K219 Gastro-esophageal reflux disease without esophagitis: Secondary | ICD-10-CM | POA: Diagnosis not present

## 2017-11-27 DIAGNOSIS — R05 Cough: Secondary | ICD-10-CM | POA: Diagnosis not present

## 2018-01-28 DIAGNOSIS — K219 Gastro-esophageal reflux disease without esophagitis: Secondary | ICD-10-CM | POA: Diagnosis not present

## 2018-01-28 DIAGNOSIS — R05 Cough: Secondary | ICD-10-CM | POA: Diagnosis not present

## 2018-02-15 DIAGNOSIS — R232 Flushing: Secondary | ICD-10-CM | POA: Diagnosis not present

## 2018-02-15 DIAGNOSIS — R6882 Decreased libido: Secondary | ICD-10-CM | POA: Diagnosis not present

## 2018-02-15 DIAGNOSIS — G479 Sleep disorder, unspecified: Secondary | ICD-10-CM | POA: Diagnosis not present

## 2018-02-15 DIAGNOSIS — R5383 Other fatigue: Secondary | ICD-10-CM | POA: Diagnosis not present

## 2018-02-17 DIAGNOSIS — R5383 Other fatigue: Secondary | ICD-10-CM | POA: Diagnosis not present

## 2018-02-17 DIAGNOSIS — R6882 Decreased libido: Secondary | ICD-10-CM | POA: Diagnosis not present

## 2018-02-17 DIAGNOSIS — R232 Flushing: Secondary | ICD-10-CM | POA: Diagnosis not present

## 2018-02-17 DIAGNOSIS — G479 Sleep disorder, unspecified: Secondary | ICD-10-CM | POA: Diagnosis not present

## 2018-03-11 DIAGNOSIS — F330 Major depressive disorder, recurrent, mild: Secondary | ICD-10-CM | POA: Diagnosis not present

## 2018-05-03 ENCOUNTER — Ambulatory Visit
Admission: RE | Admit: 2018-05-03 | Discharge: 2018-05-03 | Disposition: A | Discharge status: Home / Self Care | Payer: BLUE CROSS/BLUE SHIELD | Source: Ambulatory Visit | Attending: Obstetrics & Gynecology | Admitting: Obstetrics & Gynecology

## 2018-05-03 ENCOUNTER — Other Ambulatory Visit: Payer: Self-pay | Admitting: Obstetrics & Gynecology

## 2018-05-03 DIAGNOSIS — N6011 Diffuse cystic mastopathy of right breast: Secondary | ICD-10-CM | POA: Diagnosis not present

## 2018-05-03 DIAGNOSIS — N6310 Unspecified lump in the right breast, unspecified quadrant: Secondary | ICD-10-CM

## 2018-05-03 DIAGNOSIS — R928 Other abnormal and inconclusive findings on diagnostic imaging of breast: Secondary | ICD-10-CM | POA: Diagnosis not present

## 2018-06-07 DIAGNOSIS — R6882 Decreased libido: Secondary | ICD-10-CM | POA: Diagnosis not present

## 2018-06-07 DIAGNOSIS — R5383 Other fatigue: Secondary | ICD-10-CM | POA: Diagnosis not present

## 2018-06-07 DIAGNOSIS — G479 Sleep disorder, unspecified: Secondary | ICD-10-CM | POA: Diagnosis not present

## 2018-06-10 DIAGNOSIS — N951 Menopausal and female climacteric states: Secondary | ICD-10-CM | POA: Diagnosis not present

## 2018-06-10 DIAGNOSIS — G479 Sleep disorder, unspecified: Secondary | ICD-10-CM | POA: Diagnosis not present

## 2018-06-10 DIAGNOSIS — R6882 Decreased libido: Secondary | ICD-10-CM | POA: Diagnosis not present

## 2018-06-10 DIAGNOSIS — R232 Flushing: Secondary | ICD-10-CM | POA: Diagnosis not present

## 2018-06-21 IMAGING — US ULTRASOUND RIGHT BREAST LIMITED
1 series · 4 of 4 positions shown · non-contrast
Comparison: With priors.

CLINICAL DATA: Short-term interval follow-up of probable cluster of
cysts in the right breast.

EXAM:
DIGITAL DIAGNOSTIC RIGHT MAMMOGRAM WITH CAD AND TOMO
ULTRASOUND RIGHT BREAST

[Series 1: ultrasound right breast limited · 0.07mm/px · 4 of 4 slices shown]
[im 1/4]
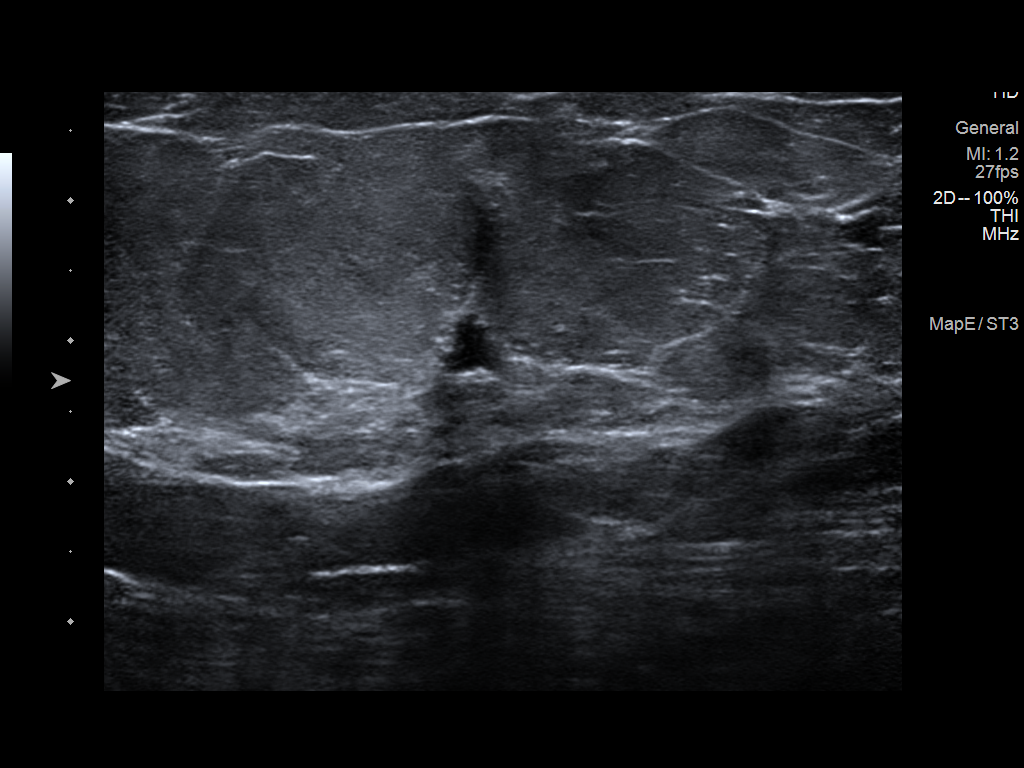
[im 2/4]
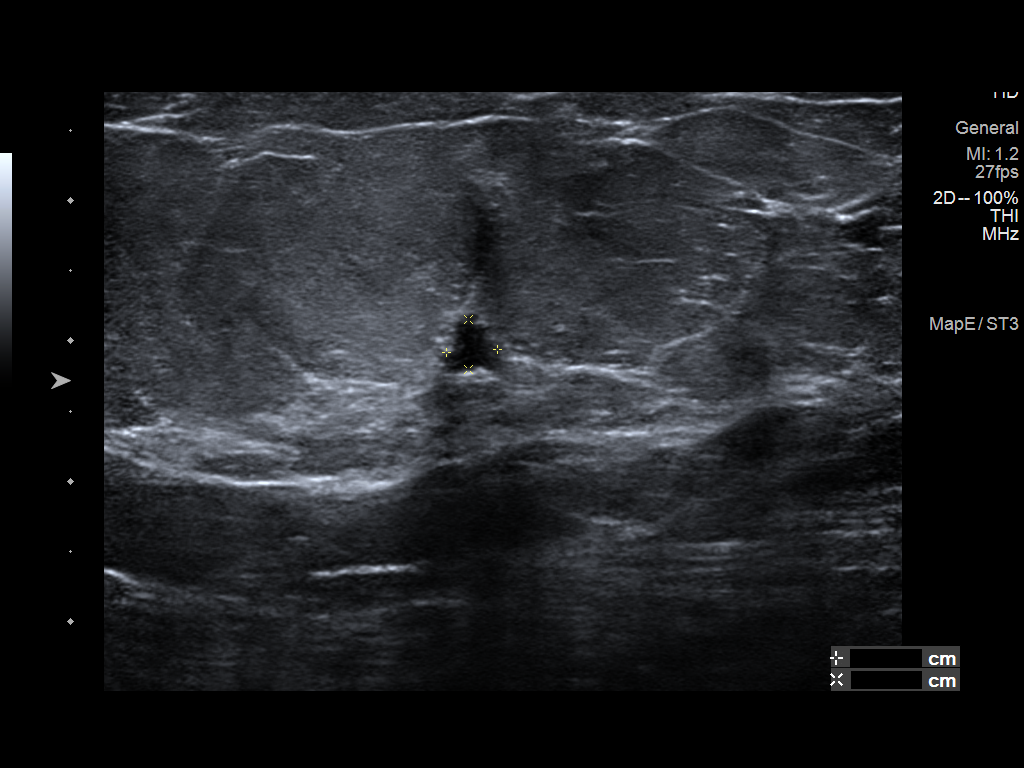
[im 3/4]
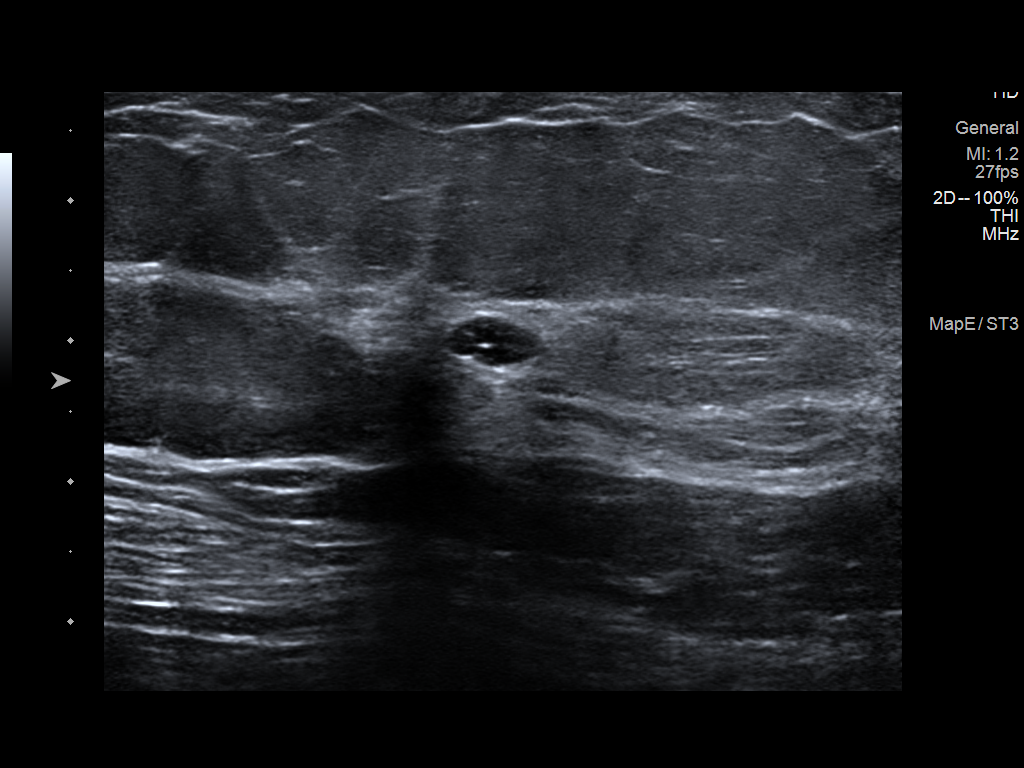
[im 4/4]
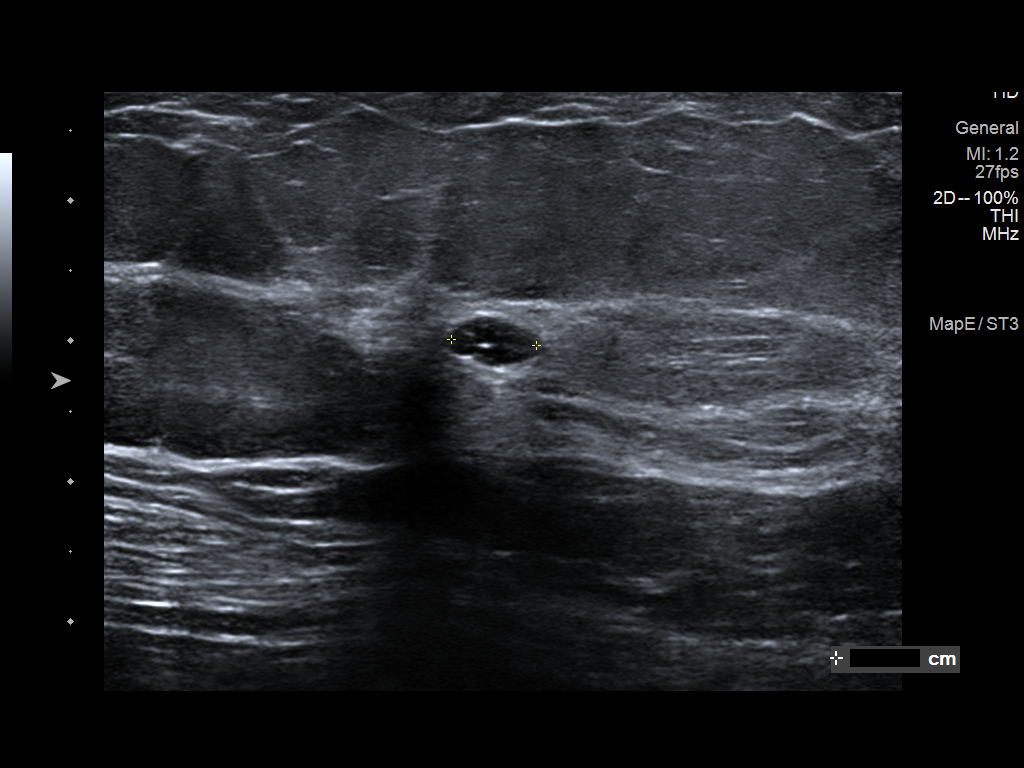

[4 of 4 positions shown; findings below may reference images not displayed]

ACR Breast Density Category b: There are scattered areas of
fibroglandular density.
FINDINGS: Previously described mass in the upper inner quadrant of the right
breast is less apparent on today's images. No suspicious mass,
malignant type microcalcifications or distortion detected.

Mammographic images were processed with CAD.

On physical exam, no mass is palpated in the upper-inner quadrant of
the right breast.

Targeted ultrasound is performed, showing a cluster of near anechoic
cysts in the right breast at 1 o'clock 4 cm from the nipple
measuring 4 x 4 x 6 mm. On the prior ultrasound dated 11/04/2017 it
measured 7 x 4 x 6 mm.
IMPRESSION: Probable benign cluster of cysts in the right breast.

RECOMMENDATION:
Bilateral diagnostic mammogram and right breast ultrasound in 6
months is recommended.

I have discussed the findings and recommendations with the patient.
Results were also provided in writing at the conclusion of the
visit. If applicable, a reminder letter will be sent to the patient
regarding the next appointment.

BI-RADS CATEGORY  3: Probably benign.

## 2018-07-01 DIAGNOSIS — M79602 Pain in left arm: Secondary | ICD-10-CM | POA: Diagnosis not present

## 2018-07-01 DIAGNOSIS — M79631 Pain in right forearm: Secondary | ICD-10-CM | POA: Diagnosis not present

## 2018-07-21 DIAGNOSIS — R739 Hyperglycemia, unspecified: Secondary | ICD-10-CM | POA: Diagnosis not present

## 2018-07-21 DIAGNOSIS — I10 Essential (primary) hypertension: Secondary | ICD-10-CM | POA: Diagnosis not present

## 2018-07-28 DIAGNOSIS — R739 Hyperglycemia, unspecified: Secondary | ICD-10-CM | POA: Diagnosis not present

## 2018-07-28 DIAGNOSIS — Z0000 Encounter for general adult medical examination without abnormal findings: Secondary | ICD-10-CM | POA: Diagnosis not present

## 2018-07-28 DIAGNOSIS — E785 Hyperlipidemia, unspecified: Secondary | ICD-10-CM | POA: Diagnosis not present

## 2018-09-07 DIAGNOSIS — R6882 Decreased libido: Secondary | ICD-10-CM | POA: Diagnosis not present

## 2018-09-07 DIAGNOSIS — N951 Menopausal and female climacteric states: Secondary | ICD-10-CM | POA: Diagnosis not present

## 2018-09-07 DIAGNOSIS — R5383 Other fatigue: Secondary | ICD-10-CM | POA: Diagnosis not present

## 2018-09-07 DIAGNOSIS — R232 Flushing: Secondary | ICD-10-CM | POA: Diagnosis not present

## 2018-09-07 DIAGNOSIS — G479 Sleep disorder, unspecified: Secondary | ICD-10-CM | POA: Diagnosis not present

## 2018-09-09 DIAGNOSIS — R6882 Decreased libido: Secondary | ICD-10-CM | POA: Diagnosis not present

## 2018-09-09 DIAGNOSIS — R232 Flushing: Secondary | ICD-10-CM | POA: Diagnosis not present

## 2018-09-09 DIAGNOSIS — N951 Menopausal and female climacteric states: Secondary | ICD-10-CM | POA: Diagnosis not present

## 2018-11-04 DIAGNOSIS — Z124 Encounter for screening for malignant neoplasm of cervix: Secondary | ICD-10-CM | POA: Diagnosis not present

## 2018-11-04 DIAGNOSIS — Z01419 Encounter for gynecological examination (general) (routine) without abnormal findings: Secondary | ICD-10-CM | POA: Diagnosis not present

## 2018-11-04 DIAGNOSIS — Z6830 Body mass index (BMI) 30.0-30.9, adult: Secondary | ICD-10-CM | POA: Diagnosis not present

## 2018-11-05 ENCOUNTER — Ambulatory Visit
Admission: RE | Admit: 2018-11-05 | Discharge: 2018-11-05 | Disposition: A | Discharge status: Home / Self Care | Payer: BLUE CROSS/BLUE SHIELD | Source: Ambulatory Visit | Attending: Obstetrics & Gynecology | Admitting: Obstetrics & Gynecology

## 2018-11-05 DIAGNOSIS — N6310 Unspecified lump in the right breast, unspecified quadrant: Secondary | ICD-10-CM

## 2018-11-05 DIAGNOSIS — R928 Other abnormal and inconclusive findings on diagnostic imaging of breast: Secondary | ICD-10-CM | POA: Diagnosis not present

## 2018-11-05 DIAGNOSIS — N6312 Unspecified lump in the right breast, upper inner quadrant: Secondary | ICD-10-CM | POA: Diagnosis not present

## 2019-11-08 ENCOUNTER — Other Ambulatory Visit: Payer: Self-pay | Admitting: Obstetrics and Gynecology

## 2019-11-08 DIAGNOSIS — N6310 Unspecified lump in the right breast, unspecified quadrant: Secondary | ICD-10-CM

## 2020-10-22 DIAGNOSIS — M79645 Pain in left finger(s): Secondary | ICD-10-CM | POA: Diagnosis not present

## 2020-10-31 DIAGNOSIS — H40013 Open angle with borderline findings, low risk, bilateral: Secondary | ICD-10-CM | POA: Diagnosis not present

## 2020-11-15 DIAGNOSIS — Z20822 Contact with and (suspected) exposure to covid-19: Secondary | ICD-10-CM | POA: Diagnosis not present

## 2020-12-13 ENCOUNTER — Emergency Department (HOSPITAL_COMMUNITY): Payer: BLUE CROSS/BLUE SHIELD

## 2020-12-13 ENCOUNTER — Emergency Department (HOSPITAL_COMMUNITY)
Admission: EM | Admit: 2020-12-13 | Discharge: 2020-12-13 | Discharge status: Against Medical Advice | Payer: BLUE CROSS/BLUE SHIELD | Attending: Emergency Medicine | Admitting: Emergency Medicine

## 2020-12-13 DIAGNOSIS — I639 Cerebral infarction, unspecified: Secondary | ICD-10-CM | POA: Diagnosis not present

## 2020-12-13 DIAGNOSIS — E039 Hypothyroidism, unspecified: Secondary | ICD-10-CM | POA: Diagnosis not present

## 2020-12-13 DIAGNOSIS — I959 Hypotension, unspecified: Secondary | ICD-10-CM | POA: Diagnosis not present

## 2020-12-13 DIAGNOSIS — Z79899 Other long term (current) drug therapy: Secondary | ICD-10-CM | POA: Insufficient documentation

## 2020-12-13 DIAGNOSIS — R29818 Other symptoms and signs involving the nervous system: Secondary | ICD-10-CM | POA: Diagnosis not present

## 2020-12-13 DIAGNOSIS — Z959 Presence of cardiac and vascular implant and graft, unspecified: Secondary | ICD-10-CM | POA: Insufficient documentation

## 2020-12-13 DIAGNOSIS — R531 Weakness: Secondary | ICD-10-CM | POA: Diagnosis not present

## 2020-12-13 DIAGNOSIS — I10 Essential (primary) hypertension: Secondary | ICD-10-CM | POA: Diagnosis not present

## 2020-12-13 DIAGNOSIS — R200 Anesthesia of skin: Secondary | ICD-10-CM | POA: Diagnosis not present

## 2020-12-13 DIAGNOSIS — R000 Tachycardia, unspecified: Secondary | ICD-10-CM | POA: Diagnosis not present

## 2020-12-13 DIAGNOSIS — R202 Paresthesia of skin: Secondary | ICD-10-CM

## 2020-12-13 LAB — DIFFERENTIAL
Abs Immature Granulocytes: 0.01 10*3/uL (ref 0.00–0.07)
Basophils Absolute: 0 10*3/uL (ref 0.0–0.1)
Basophils Relative: 1 %
Eosinophils Absolute: 0.2 10*3/uL (ref 0.0–0.5)
Eosinophils Relative: 3 %
Immature Granulocytes: 0 %
Lymphocytes Relative: 49 %
Lymphs Abs: 2.9 10*3/uL (ref 0.7–4.0)
Monocytes Absolute: 0.4 10*3/uL (ref 0.1–1.0)
Monocytes Relative: 6 %
Neutro Abs: 2.5 10*3/uL (ref 1.7–7.7)
Neutrophils Relative %: 41 %

## 2020-12-13 LAB — I-STAT CHEM 8, ED
BUN: 13 mg/dL (ref 6–20)
Calcium, Ion: 1.15 mmol/L (ref 1.15–1.40)
Chloride: 105 mmol/L (ref 98–111)
Creatinine, Ser: 0.7 mg/dL (ref 0.44–1.00)
Glucose, Bld: 123 mg/dL — ABNORMAL HIGH (ref 70–99)
HCT: 44 % (ref 36.0–46.0)
Hemoglobin: 15 g/dL (ref 12.0–15.0)
Potassium: 3.8 mmol/L (ref 3.5–5.1)
Sodium: 140 mmol/L (ref 135–145)
TCO2: 23 mmol/L (ref 22–32)

## 2020-12-13 LAB — CBC
HCT: 44.4 % (ref 36.0–46.0)
Hemoglobin: 14.6 g/dL (ref 12.0–15.0)
MCH: 30.9 pg (ref 26.0–34.0)
MCHC: 32.9 g/dL (ref 30.0–36.0)
MCV: 93.9 fL (ref 80.0–100.0)
Platelets: 175 10*3/uL (ref 150–400)
RBC: 4.73 MIL/uL (ref 3.87–5.11)
RDW: 12.8 % (ref 11.5–15.5)
WBC: 6 10*3/uL (ref 4.0–10.5)
nRBC: 0 % (ref 0.0–0.2)

## 2020-12-13 LAB — COMPREHENSIVE METABOLIC PANEL
ALT: 25 U/L (ref 0–44)
AST: 21 U/L (ref 15–41)
Albumin: 4.1 g/dL (ref 3.5–5.0)
Alkaline Phosphatase: 50 U/L (ref 38–126)
Anion gap: 11 (ref 5–15)
BUN: 12 mg/dL (ref 6–20)
CO2: 21 mmol/L — ABNORMAL LOW (ref 22–32)
Calcium: 9.6 mg/dL (ref 8.9–10.3)
Chloride: 108 mmol/L (ref 98–111)
Creatinine, Ser: 0.8 mg/dL (ref 0.44–1.00)
GFR, Estimated: >60 mL/min (ref >60)
Glucose, Bld: 127 mg/dL — ABNORMAL HIGH (ref 70–99)
Potassium: 4 mmol/L (ref 3.5–5.1)
Sodium: 140 mmol/L (ref 135–145)
Total Bilirubin: 0.4 mg/dL (ref 0.3–1.2)
Total Protein: 6.6 g/dL (ref 6.5–8.1)

## 2020-12-13 LAB — APTT: aPTT: 27 seconds (ref 24–36)

## 2020-12-13 LAB — I-STAT BETA HCG BLOOD, ED (MC, WL, AP ONLY): I-stat hCG, quantitative: <5 m[IU]/mL (ref <5)

## 2020-12-13 LAB — PROTIME-INR
INR: 1 (ref 0.8–1.2)
Prothrombin Time: 12.9 seconds (ref 11.4–15.2)

## 2020-12-13 LAB — CBG MONITORING, ED: Glucose-Capillary: 117 mg/dL — ABNORMAL HIGH (ref 70–99)

## 2020-12-13 MED ORDER — SODIUM CHLORIDE 0.9% FLUSH
3.0000 mL | Freq: Once | INTRAVENOUS | Status: AC
  Administered 2020-12-13: 3 mL via INTRAVENOUS

## 2020-12-13 MED ORDER — HYDRALAZINE HCL 25 MG PO TABS
50.0000 mg | ORAL_TABLET | Freq: Once | ORAL | Status: AC
  Administered 2020-12-13: 50 mg via ORAL
  Filled 2020-12-13: qty 2

## 2020-12-13 NOTE — Consult Note (Signed)
Neurology Code Stroke Note  CC: Numbness on my left side  History is obtained from: patient, EMS, chart reveiew  HPI: Shelley Zimmerman is a 54 y.o. female with a medical history significant for hypertension, migraines, cardiac catheterization, and anxiety who presented to [facility] 12/30 as a stroke alert for acute onset confusion, left arm weakness,  followed by left scalp numbness and tingling that spread down her entire left side of her body. Her initial blood pressure form EMS was 220/110 and she endorses not taking her antihypertensive medication for 2-3 days. Denies headache or vision changes. On arrival to the ER, the left sided numbness and tingling had largely resolved as well as the confusion and left arm weakness and the tingling then had moved to the ulnar surface of the right hand up to her right elbow. She endorses taking 1/2 of a CBD gummy (she has never taken one before) about 4 hours prior to arrival to the ER.  Of note is a [facility] visit in November 2017 for similar symptoms of left-sided sensory disturbance, generalized vibrations and body twitching with extensive work-up and without definitive etiology. Thought due to fentanyl patch   LKW: 14:25 tpa given?: No, nondisabling symptoms, likely nonstroke.  IR Thrombectomy? No, nondisabling symptoms, likely nonstroke Modified Rankin Scale: 0-Completely asymptomatic and back to baseline post- stroke  NIHSS:  Interval: Initial (12/30 1545) Level of Consciousness (1a.)   : Alert, keenly responsive (12/30 1545) LOC Questions (1b. )   +: Answers both questions correctly (12/30 1545) LOC Commands (1c. )   + : Performs both tasks correctly (12/30 1545) Best Gaze (2. )  +: Normal (12/30 1545) Visual (3. )  +: No visual loss (12/30 1545) Facial Palsy (4. )    : Normal symmetrical movements (12/30 1545) Motor Arm, Left (5a. )   +: No drift (12/30 1545) Motor Arm, Right (5b. )   +: No drift (12/30 1545) Motor Leg, Left  (6a. )   +: No drift (12/30 1545) Motor Leg, Right (6b. )   +: No drift (12/30 1545) Limb Ataxia (7. ): Absent (12/30 1545) Sensory (8. )   +: Mild-to-moderate sensory loss, patient feels pinprick is less sharp or is dull on the affected side, or there is a loss of superficial pain with pinprick, but patient is aware of being touched (12/30 1545) Best Language (9. )   +: No aphasia (12/30 1545) Dysarthria (10. ): Normal (12/30 1545) Extinction/Inattention (11.)   +: No Abnormality (12/30 1545) Modified SS Total  +: 1 (12/30 1545) Complete NIHSS TOTAL: 1 (12/30 1545)  Review of Systems: ROS was attempted today and was able to be performed.  Systems assessed include - Constitutional, Eyes, HENT, Respiratory, Cardiovascular, Gastrointestinal, Genitourinary, Integument/breast, Hematologic/lymphatic, Musculoskeletal, Neurological, Behavioral/Psych, Endocrine, Allergic/Immunologic - the patient complains of only the following symptoms, and all other reviewed systems are negative.   Past Medical History:  Diagnosis Date   Anxiety    Depression    GERD (gastroesophageal reflux disease)    Hypertension    Hypothyroidism    has resolved    Kidney stones    Migraine    S/P cardiac cath 2011   Vaginal delivery 1996   Family History  Problem Relation Age of Onset   Diabetes Mellitus II Father    Hypertension Father    Hypothyroidism Sister    Cardiomyopathy Mother    Lung cancer Paternal Grandfather    Diabetes Sister    Thyroid disease Sister  Stroke Neg Hx    Seizures Neg Hx    Social History:  reports that she has never smoked. She has never used smokeless tobacco. She reports current alcohol use. She reports that she does not use drugs.  Took 1/2 of a CBD gummy 12/30 that she has never taken before  Current Scheduled Medications: Current Outpatient Medications  Medication Instructions   Cholecalciferol (VITAMIN D PO) 50,000 Units, Oral, Weekly   LORazepam  (ATIVAN) 0.5 mg, Oral, Daily at bedtime   Multiple Vitamin (MULTIVITAMIN) capsule 1 capsule, Oral, Daily, Reported on 04/29/2016   olmesartan (BENICAR) 20 mg, Oral, Daily   Progesterone Micronized (PROGESTERONE PO) 50 mg, Oral, Daily   UNABLE TO FIND 1 Dose, 3 times weekly, Med Name: Testosterone- estradiol - low dose    valACYclovir (VALTREX) 1,000 mg, Oral, As needed   Exam: Current vital signs: BP (!) 162/104 (BP Location: Left Arm)    Temp 97.6 F (36.4 C) (Oral)    Resp 17    Ht 5\' 2"  (1.575 m)    Wt 81.2 kg    SpO2 100%    BMI 32.74 kg/m   Physical Exam  Constitutional: Appears well-developed and well-nourished, appears anxious with shaking of her bilateral hands Psych: Affect appropriate, anxious and nervous HENT: Nornocephalic/atraumatic, wears eyeglasses Cardiovascular: Normal rate on cardiac monitor.  Respiratory: Effort normal, no excessive work of breathing  GI: Soft.  Non-tender, non-distended. Skin: WDI  Neuro: Mental Status: Patient is awake, alert, oriented to person, place, month, year, and situation. She is able to give a clear and coherent history without signs of aphasia, dysarthria, or neglect. Naming, repetition, and comprehension intact. Cranial Nerves: II: Visual Fields are full. Pupils are equal, round, and reactive to light 66mm/brisk. III,IV, VI: EOMI without ptosis or diploplia.  V: Facial sensation is symmetric to light touch and temperature VII: Face is symmetric resting and smiling. Able to raise eyebrows and puff cheeks. VIII: Hearing is intact to voice X: Phonation intact XI: Shoulder shrug is symmetric. XII: Tongue protrudes midline without atrophy or fasciculations.  Motor: Tone is normal. Bulk is normal. 5/5 strength was present in all four extremities without pronator drift. Grip strength/push/pull symmetric in upper extremities. States her left arm weakness has resolved on arrival to hospital.  Sensory: Endorses sensation changes  (beginning in left scalp down left arm and left leg, moving to just the left shoulder to elbow and then to the right ulnar surface of her right hand up to her elbow) Plantars: Toes are downgoing bilaterally. Cerebellar: FNF and HKS are intact bilaterally Gait: Deferred  I have reviewed labs in epic and the pertinent results are: CBC    Component Value Date/Time   WBC 6.0 12/13/2020 1530   RBC 4.73 12/13/2020 1530   HGB 15.0 12/13/2020 1540   HCT 44.0 12/13/2020 1540   PLT 175 12/13/2020 1530   MCV 93.9 12/13/2020 1530   MCH 30.9 12/13/2020 1530   MCHC 32.9 12/13/2020 1530   RDW 12.8 12/13/2020 1530   LYMPHSABS 2.9 12/13/2020 1530   MONOABS 0.4 12/13/2020 1530   EOSABS 0.2 12/13/2020 1530   BASOSABS 0.0 12/13/2020 1530   I have reviewed the images obtained: CTH:  IMPRESSION: There is no acute intracranial hemorrhage or evidence of acute infarction. ASPECT score is 10.  Primary Diagnosis:  Hypertensive encephalopathy versus cerebral hypoperfusion without infarction versus adverse effect of drug  Secondary Diagnosis: Essential (primary) hypertension  Impression: Ms. Edens is a 54 year old female who presents to the  ED as a stroke alert for transient left arm weakness, left sided numbness and tingling, and a brief episode of confusion evidenced by disorientation. She was found by EMS to have a blood pressure of 220/110 initially. Ms. Spayd also endorses eating 1/2 of a CBD gummy approximately 4 hours prior to hospital arrival. - Etiology of numbness and tingling DDx include hypertensive encephalopathy, adverse effect of ingested supplement, anxiety, or cerebral hypoperfusion without infarction.  - Ms. Littlefield's confusion and left arm weakness had resolved on arrival to the hospital. MRI pending to rule out TIA/infarction. Low suspicion for stroke with the varying nature of her numbness and tingling.   Recommendations: 1. MRI brain without contrast  2. Anti-hypertensive  medication to decrease blood pressure 3. Frequent neuro checks  Assessment and plan discussed with attending provider and they are in agreement. Lanae Boast, AGAC-NP [facility] [phone]  ATTENDING NOTE: I reviewed above note and agree with the assessment and plan. Pt was seen and examined.   54 year old female with history of hypertension presented to ED for code stroke due to left-sided numbness, and then improved but started to have right arm numbness.  Time onset 2:25 PM when she was driving she felt brief moment of disorientation and then started to have left scalp numbness tingling spreading to left face and arm and leg.  EMS called, en route, left sided hemiparesthesia improved to only localized to left hand, however started to have right arm paresthesia.  Patient denies any headache, visual or speech changes.  Patient does seem to be anxious and tremulous on arrival.  On examination, patient awake alert, orientated, no aphasia, speech clear, follows simple commands, able to name and repeat.  Cranial nerve intact.  Letter upper and lower extremity equal strengths.  Still has patchy decrease light touch sensation in right upper extremity and left hand.  Finger-to-nose intact.  NIH score 1 for sensory deficit.  CT no acute abnormality.  Apparently, patient had similar episode about 5 years ago (see Dr. Trevor Mace note on 11/12/2016) with possible fentanyl patch.  About 4 hours prior to ED arrival today, she used the first time CBD gummy (later found out that gummy also contains THC).  Therefore, drug side effect is in DDx.  Other DDx including anxiety, and hypertensive encephalopathy with EMS reporting BP on arrival 200s.  Currently in ER, BP gradually trending down, and she felt symptoms further improves over time.  Recommended MRI stat to rule out stroke.  However patient refused MRI given: Health is also out of her insurance network coverage.  She requested AMA.  Discussed  with EDP Dr. Renaye Rakers, since patient symptoms has improved, BP stabilized, she is able to ambulate in ER with strong suspicion of non stroke etiology, okay for discharge AMA with close outpatient neurology follow-up with [facility] system.   Marvel Plan, MD PhD Stroke Neurology 12/13/2020 8:06 PM

## 2020-12-13 NOTE — ED Triage Notes (Signed)
New onset left side tingling starting at 1430 involving left arm tingling moved into her head and then to her trunk. Pt is now feeling tingling in the right side.

## 2020-12-13 NOTE — ED Notes (Signed)
Pt has decided to leave AMA. 

## 2020-12-13 NOTE — ED Provider Notes (Signed)
[place] EMERGENCY DEPARTMENT Provider Note   CSN: [id number] Arrival date & time: 12/13/20  1526  An emergency department physician performed an initial assessment on this suspected stroke patient at 27.  History Chief Complaint  Patient presents with   left side    Tingling on left side starting at [place] is a 54 y.o. female with a history of hypertension presenting to emergency department with left-sided paresthesias.  The patient reports that she took a CBD containing gummy for the first time in her life around noon today.  She reports around 230 pm she noted paresthesias and tingling on the left side of her face, migrating to the left arm and then the left leg.  She fell like her left arm or hand was weak.  She denies headache or blurred vision with that.  She denies chest pain or palpitations.  She reports her symptoms have eased up since arrival but she is now having paresthesias down her right arm, specifically down the lateral side between her elbow and her fourth and fifth fingers.  She reports she had had similar symptoms once in the past many years ago when she had a "patch of medicine put on my neck after a pelvic procedure."    She reports she has not been taking her blood pressure medicine for at least 2 days because of her busy schedule.  She normally takes Benicar 20 mg daily.  She denies history of high cholesterol, diabetes, smoking, any known history of TIA or stroke.  She reports she is fully vaccinated for Covid.  She denies any fevers, cough, congestion, or any illness symptoms earlier this week.  She had been in her usual state of health prior to this.  HPI     Past Medical History:  Diagnosis Date   Anxiety    Depression    GERD (gastroesophageal reflux disease)    Hypertension    Hypothyroidism    has resolved    Kidney stones    Migraine    S/P cardiac cath 2011   Vaginal delivery 1996    Patient  Active Problem List   Diagnosis Date Noted   Degenerative disc disease, cervical 02/07/2016   Left sided numbness 12/27/2015   Vision changes 12/27/2015   Dizziness 12/27/2015   Paresthesia 12/27/2015   Essential hypertension 12/23/2015   Palpitations 12/23/2015   History of migraine headaches 12/23/2015   Hyperlipidemia 12/23/2015   Dizziness and giddiness 11/06/2015   Dyspnea and respiratory abnormality 08/16/2015   Blue lips 08/16/2015    Past Surgical History:  Procedure Laterality Date   CARDIAC CATHETERIZATION N/A 11/01/2015   Procedure: Right Heart Cath;  Surgeon: Larey Dresser, MD;  Location: [facility];  Service: Cardiovascular;  Laterality: N/A;   [place] N/A 11/23/2015   Procedure: HYSTEROSCOPY WITH NOVASURE AND Emi Holes;  Surgeon: Alden Hipp, MD;  Location: [facility];  Service: Gynecology;  Laterality: N/A;   LITHOTRIPSY  2007   URETEROSCOPY WITH HOLMIUM LASER LITHOTRIPSY       OB History   No obstetric history on file.     Family History  Problem Relation Age of Onset   Diabetes Mellitus II Father    Hypertension Father    Hypothyroidism Sister    Cardiomyopathy Mother    Lung cancer Paternal Grandfather    Diabetes Sister    Thyroid disease  Sister    Stroke Neg Hx    Seizures Neg Hx     Social History   Tobacco Use   Smoking status: Never Smoker   Smokeless tobacco: Never Used  Substance Use Topics   Alcohol use: Yes    Alcohol/week: 0.0 standard drinks    Comment: occassional   Drug use: No    Home Medications Prior to Admission medications   Medication Sig Start Date End Date Taking? Authorizing Provider  Cholecalciferol (VITAMIN D PO) Take 50,000 Units by mouth once a week.    [provider]  LORazepam (ATIVAN) 0.5 MG tablet Take 1 tablet (0.5 mg total) by mouth at bedtime. 11/12/16   Anson Fret, MD  Multiple  Vitamin (MULTIVITAMIN) capsule Take 1 capsule by mouth daily. Reported on 04/29/2016    [provider]  olmesartan (BENICAR) 40 MG tablet Take 20 mg by mouth daily.    [provider]  Progesterone Micronized (PROGESTERONE PO) Take 50 mg by mouth daily.    [provider]  UNABLE TO FIND 1 Dose 3 (three) times a week. Med Name: Testosterone- estradiol - low dose    [provider]  valACYclovir (VALTREX) 1000 MG tablet Take 1,000 mg by mouth as needed.     [provider]    Allergies    Diovan [valsartan], Wellbutrin [bupropion], and Zoloft [sertraline hcl]  Review of Systems   Review of Systems  Constitutional: Negative for chills and fever.  HENT: Negative for sore throat.   Eyes: Negative for pain and visual disturbance.  Respiratory: Negative for cough and shortness of breath.   Cardiovascular: Negative for chest pain and palpitations.  Gastrointestinal: Negative for abdominal pain and vomiting.  Musculoskeletal: Negative for arthralgias and back pain.  Skin: Negative for color change and rash.  Neurological: Positive for facial asymmetry, weakness and numbness. Negative for dizziness, seizures, syncope, speech difficulty, light-headedness and headaches.  Psychiatric/Behavioral: Negative for agitation and confusion.  All other systems reviewed and are negative.   Physical Exam Updated Vital Signs BP (!) 151/115    Pulse 86    Temp 97.6 F (36.4 C) (Oral)    Resp 15    Ht 5\' 2"  (1.575 m)    Wt 81.2 kg    SpO2 98%    BMI 32.74 kg/m   Physical Exam Vitals and nursing note reviewed.  Constitutional:      General: She is not in acute distress.    Appearance: She is well-developed and well-nourished.  HENT:     Head: Normocephalic and atraumatic.  Eyes:     Conjunctiva/sclera: Conjunctivae normal.  Cardiovascular:     Rate and Rhythm: Normal rate and regular rhythm.     Heart sounds: No murmur heard.   Pulmonary:     Effort:  Pulmonary effort is normal. No respiratory distress.     Breath sounds: Normal breath sounds.  Abdominal:     Palpations: Abdomen is soft.     Tenderness: There is no abdominal tenderness.  Musculoskeletal:        General: No edema.     Cervical back: Neck supple.  Skin:    General: Skin is warm and dry.  Neurological:     General: No focal deficit present.     Mental Status: She is alert and oriented to person, place, and time.     GCS: GCS eye subscore is 4. GCS verbal subscore is 5. GCS motor subscore is 6.  Cranial Nerves: Cranial nerves are intact.     Sensory: Sensation is intact.     Motor: Motor function is intact. No weakness.     Coordination: Coordination is intact. Finger-Nose-Finger Test normal.  Psychiatric:        Mood and Affect: Mood and affect and mood normal.        Behavior: Behavior normal.     ED Results / Procedures / Treatments   Labs (all labs ordered are listed, but only abnormal results are displayed) Labs Reviewed  COMPREHENSIVE METABOLIC PANEL - Abnormal; Notable for the following components:      Result Value   CO2 21 (*)    Glucose, Bld 127 (*)    All other components within normal limits  I-STAT CHEM 8, ED - Abnormal; Notable for the following components:   Glucose, Bld 123 (*)    All other components within normal limits  CBG MONITORING, ED - Abnormal; Notable for the following components:   Glucose-Capillary 117 (*)    All other components within normal limits  PROTIME-INR  APTT  CBC  DIFFERENTIAL  I-STAT BETA HCG BLOOD, ED ([facility], [facility], [facility] ONLY)    EKG EKG Interpretation  Date/Time:  Thursday December 13 2020 15:50:32 EST Ventricular Rate:  93 PR Interval:    QRS Duration: 84 QT Interval:  345 QTC Calculation: 430 R Axis:   66 Text Interpretation: Sinus rhythm No STEMI Confirmed by Octaviano Glow [phone]) on 12/13/2020 4:12:44 PM   Radiology CT HEAD CODE STROKE WO CONTRAST  Result Date: 12/13/2020 CLINICAL DATA:  Code  stroke.  Left-sided numbness EXAM: CT HEAD WITHOUT CONTRAST TECHNIQUE: Contiguous axial images were obtained from the base of the skull through the vertex without intravenous contrast. COMPARISON:  None. FINDINGS: Brain: There is no acute intracranial hemorrhage, mass effect, or edema. Gray-white differentiation is preserved. Ventricles and sulci are normal in size and configuration. There is no extra-axial collection. Vascular: No hyperdense vessel. Minimal intracranial atherosclerotic calcification is present at the skull base. Skull: Unremarkable. Sinuses/Orbits: No acute abnormality. Other: Mastoid air cells are clear. ASPECTS ([place] Stroke Program Early CT Score) - Ganglionic level infarction (caudate, lentiform nuclei, internal capsule, insula, M1-M3 cortex): 7 - Supraganglionic infarction (M4-M6 cortex): 3 Total score (0-10 with 10 being normal): 10 IMPRESSION: There is no acute intracranial hemorrhage or evidence of acute infarction. ASPECT score is 10. These results were communicated to Dr. Erlinda Hong at 3:40 pm on 12/13/2020 by text page via the [facility] messaging system. Electronically Signed   By: Macy Mis M.D.   On: 12/13/2020 15:42    Procedures Procedures (including critical care time)  Medications Ordered in ED Medications  sodium chloride flush (NS) 0.9 % injection 3 mL (3 mLs Intravenous Given 12/13/20 1609)  hydrALAZINE (APRESOLINE) tablet 50 mg (50 mg Oral Given 12/13/20 1611)    ED Course  I have reviewed the triage vital signs and the nursing notes.  Pertinent labs & imaging results that were available during my care of the patient were reviewed by me and considered in my medical decision making (see chart for details).  Patient presents as a code stroke with onset of left-sided paresthesias and weakness at 2:30 PM today.  Differential diagnosis includes stroke versus TIA versus hypertensive emergency versus side effect of CBD/THC versus other  I ordered, reviewed, and  interpreted labs, which were largely unremarkable I ordered imaging studies which included CTH stroke protocol I independently visualized and interpreted imaging which showed no acute findings and the  monitor tracing which showed NSR ECG per my interpretation with NSR, no acute ischemia I consulted neurology and discussed lab and imaging findings   Clinical Course as of 12/13/20 1926  Thu Dec 13, 2020  1612 Neurology team recommending MRI.  They do not recommend tPA at this time, and I agree that her symptoms are extremely mild [MT]  1632 Labs reviewed and unremarkable.  MRI brain pending. [MT]  1700 After lengthy discussion with the patient and her husband, she has decided that she would not like an MRI at this time.  She will be signing out AMA.  She reports that she is out of network hearing cannot afford additional testing. [MT]  1700 She spoke to her friend again today and found out that her gummy actually contains THC as well.  She feels the side effects may be related to this,.  I did explain that we have not ruled out a stroke or hypertensive emergency, and that an MRI would be helpful for this, and she understands the risk.  She states that she will better control her blood pressure and monitor her blood pressure at home.  She also reports that if she develops new or worsening neurological symptoms to report immediately to [facility], which is in network for her. [MT]    Clinical Course User Index [MT] Sabrin Dunlevy, Carola Rhine, MD    Final Clinical Impression(s) / ED Diagnoses Final diagnoses:  Paresthesia  Hypertension, unspecified type    Rx / DC Orders ED Discharge Orders    None       Langston Masker Carola Rhine, MD 12/13/20 [phone]

## 2020-12-13 NOTE — Discharge Instructions (Addendum)
You were seen and evaluated in the emergency department today for a possible stroke.  You came in with left-sided weakness.  Your CT scan and blood tests were normal.  However, as we explained, you would need an MRI of your brain to better look for signs of stroke, TIA (mini stroke) or hypertensive emergency.  You decided that you could not afford the MRI at this time and that you would be signing yourself out AGAINST MEDICAL ADVICE.  I recommend that you regulate your blood pressure at home.  Check your blood pressure every day and do not miss any dose of your medications.  Try to avoid very salty foods.  Follow-up with your primary care doctor regarding blood pressure control.  If you have new or concerning symptoms including severe headache, weakness of your arms or legs, slurred speech, difficulty speaking, or any other concerning symptoms, you should call 911 or return immediately to the emergency department.

## 2020-12-13 NOTE — Code Documentation (Signed)
Stroke Response Nurse Documentation Code Documentation  Shelley Zimmerman is a 54 y.o. female arriving to [facility] ED via [facility] on 12/29 with past medical hx of HTN. Code stroke was activated by EMS. Patient was on her way to a nail appointment when she noted a sudden onset of left arm weakness with numbness that went from her heard to her left leg. This all started suddenly at 1425. The patient pulled over and called 911.   EMS came and activated a Code Stroke due to new onset of weakness with numbness. Stroke team at the bedside on patient arrival. Labs drawn and patient cleared for CT by EDP. Patient to CT with team. NIHSS 1,  For subjective numbness on the backside of the RIGHT forearm. The following imaging was completed:  CT. Patient is not a candidate for tPA due to stroke not suspected per neurology. Care/Plan: q2 mNIHSS/VS and MRI per Neurology. Bedside handoff with ED RN Melissa.    Lucila Maine  Stroke Response RN

## 2020-12-20 DIAGNOSIS — Z03818 Encounter for observation for suspected exposure to other biological agents ruled out: Secondary | ICD-10-CM | POA: Diagnosis not present

## 2021-01-02 DIAGNOSIS — U071 COVID-19: Secondary | ICD-10-CM | POA: Diagnosis not present

## 2021-01-02 DIAGNOSIS — J208 Acute bronchitis due to other specified organisms: Secondary | ICD-10-CM | POA: Diagnosis not present

## 2021-01-16 ENCOUNTER — Telehealth: Payer: Self-pay | Admitting: Family Medicine

## 2021-01-16 NOTE — Telephone Encounter (Signed)
Pt is trying to get more information on the functions medicine that Dr.Hilts does. Her email is [email or web address]

## 2021-01-16 NOTE — Telephone Encounter (Signed)
Message sent

## 2021-01-16 NOTE — Telephone Encounter (Signed)
Please see the email address.

## 2021-01-29 DIAGNOSIS — N951 Menopausal and female climacteric states: Secondary | ICD-10-CM | POA: Diagnosis not present

## 2021-01-29 DIAGNOSIS — E039 Hypothyroidism, unspecified: Secondary | ICD-10-CM | POA: Diagnosis not present

## 2021-01-29 DIAGNOSIS — Z124 Encounter for screening for malignant neoplasm of cervix: Secondary | ICD-10-CM | POA: Diagnosis not present

## 2021-01-29 DIAGNOSIS — Z6832 Body mass index (BMI) 32.0-32.9, adult: Secondary | ICD-10-CM | POA: Diagnosis not present

## 2021-01-29 DIAGNOSIS — Z01419 Encounter for gynecological examination (general) (routine) without abnormal findings: Secondary | ICD-10-CM | POA: Diagnosis not present

## 2021-01-31 DIAGNOSIS — E039 Hypothyroidism, unspecified: Secondary | ICD-10-CM | POA: Diagnosis not present

## 2021-01-31 DIAGNOSIS — R232 Flushing: Secondary | ICD-10-CM | POA: Diagnosis not present

## 2021-01-31 DIAGNOSIS — R6882 Decreased libido: Secondary | ICD-10-CM | POA: Diagnosis not present

## 2021-01-31 DIAGNOSIS — N951 Menopausal and female climacteric states: Secondary | ICD-10-CM | POA: Diagnosis not present

## 2021-04-30 DIAGNOSIS — E039 Hypothyroidism, unspecified: Secondary | ICD-10-CM | POA: Diagnosis not present

## 2021-04-30 DIAGNOSIS — N951 Menopausal and female climacteric states: Secondary | ICD-10-CM | POA: Diagnosis not present

## 2021-05-02 DIAGNOSIS — N898 Other specified noninflammatory disorders of vagina: Secondary | ICD-10-CM | POA: Diagnosis not present

## 2021-05-02 DIAGNOSIS — R232 Flushing: Secondary | ICD-10-CM | POA: Diagnosis not present

## 2021-05-02 DIAGNOSIS — N951 Menopausal and female climacteric states: Secondary | ICD-10-CM | POA: Diagnosis not present

## 2021-05-02 DIAGNOSIS — E039 Hypothyroidism, unspecified: Secondary | ICD-10-CM | POA: Diagnosis not present

## 2021-05-09 DIAGNOSIS — H40013 Open angle with borderline findings, low risk, bilateral: Secondary | ICD-10-CM | POA: Diagnosis not present

## 2021-05-30 DIAGNOSIS — M4312 Spondylolisthesis, cervical region: Secondary | ICD-10-CM | POA: Diagnosis not present

## 2021-05-30 DIAGNOSIS — R29898 Other symptoms and signs involving the musculoskeletal system: Secondary | ICD-10-CM | POA: Diagnosis not present

## 2021-05-30 DIAGNOSIS — M5030 Other cervical disc degeneration, unspecified cervical region: Secondary | ICD-10-CM | POA: Diagnosis not present

## 2021-05-30 DIAGNOSIS — R531 Weakness: Secondary | ICD-10-CM | POA: Diagnosis not present

## 2021-05-30 DIAGNOSIS — M47816 Spondylosis without myelopathy or radiculopathy, lumbar region: Secondary | ICD-10-CM | POA: Diagnosis not present

## 2021-05-30 DIAGNOSIS — M5412 Radiculopathy, cervical region: Secondary | ICD-10-CM | POA: Diagnosis not present

## 2021-06-03 DIAGNOSIS — N951 Menopausal and female climacteric states: Secondary | ICD-10-CM | POA: Diagnosis not present

## 2021-06-03 DIAGNOSIS — E039 Hypothyroidism, unspecified: Secondary | ICD-10-CM | POA: Diagnosis not present

## 2021-06-05 DIAGNOSIS — R232 Flushing: Secondary | ICD-10-CM | POA: Diagnosis not present

## 2021-06-05 DIAGNOSIS — N898 Other specified noninflammatory disorders of vagina: Secondary | ICD-10-CM | POA: Diagnosis not present

## 2021-06-05 DIAGNOSIS — N951 Menopausal and female climacteric states: Secondary | ICD-10-CM | POA: Diagnosis not present

## 2021-06-10 DIAGNOSIS — M545 Low back pain, unspecified: Secondary | ICD-10-CM | POA: Diagnosis not present

## 2021-06-10 DIAGNOSIS — R29898 Other symptoms and signs involving the musculoskeletal system: Secondary | ICD-10-CM | POA: Diagnosis not present

## 2021-08-02 DIAGNOSIS — E782 Mixed hyperlipidemia: Secondary | ICD-10-CM | POA: Diagnosis not present

## 2021-08-02 DIAGNOSIS — R635 Abnormal weight gain: Secondary | ICD-10-CM | POA: Diagnosis not present

## 2021-08-02 DIAGNOSIS — N951 Menopausal and female climacteric states: Secondary | ICD-10-CM | POA: Diagnosis not present

## 2021-08-02 DIAGNOSIS — E559 Vitamin D deficiency, unspecified: Secondary | ICD-10-CM | POA: Diagnosis not present

## 2021-08-02 DIAGNOSIS — R7303 Prediabetes: Secondary | ICD-10-CM | POA: Diagnosis not present

## 2021-08-02 DIAGNOSIS — E039 Hypothyroidism, unspecified: Secondary | ICD-10-CM | POA: Diagnosis not present

## 2021-08-07 DIAGNOSIS — E559 Vitamin D deficiency, unspecified: Secondary | ICD-10-CM | POA: Diagnosis not present

## 2021-08-07 DIAGNOSIS — Z1331 Encounter for screening for depression: Secondary | ICD-10-CM | POA: Diagnosis not present

## 2021-08-07 DIAGNOSIS — Z1339 Encounter for screening examination for other mental health and behavioral disorders: Secondary | ICD-10-CM | POA: Diagnosis not present

## 2021-08-07 DIAGNOSIS — Z6833 Body mass index (BMI) 33.0-33.9, adult: Secondary | ICD-10-CM | POA: Diagnosis not present

## 2021-08-07 DIAGNOSIS — E782 Mixed hyperlipidemia: Secondary | ICD-10-CM | POA: Diagnosis not present

## 2021-08-26 DIAGNOSIS — R29898 Other symptoms and signs involving the musculoskeletal system: Secondary | ICD-10-CM | POA: Diagnosis not present

## 2021-08-26 DIAGNOSIS — R5383 Other fatigue: Secondary | ICD-10-CM | POA: Diagnosis not present

## 2021-08-26 DIAGNOSIS — N898 Other specified noninflammatory disorders of vagina: Secondary | ICD-10-CM | POA: Diagnosis not present

## 2021-08-26 DIAGNOSIS — Z6833 Body mass index (BMI) 33.0-33.9, adult: Secondary | ICD-10-CM | POA: Diagnosis not present

## 2021-08-26 DIAGNOSIS — N951 Menopausal and female climacteric states: Secondary | ICD-10-CM | POA: Diagnosis not present

## 2021-09-04 DIAGNOSIS — R29898 Other symptoms and signs involving the musculoskeletal system: Secondary | ICD-10-CM | POA: Diagnosis not present

## 2021-09-05 DIAGNOSIS — N760 Acute vaginitis: Secondary | ICD-10-CM | POA: Diagnosis not present

## 2021-09-05 DIAGNOSIS — Z6833 Body mass index (BMI) 33.0-33.9, adult: Secondary | ICD-10-CM | POA: Diagnosis not present

## 2021-09-17 DIAGNOSIS — R29898 Other symptoms and signs involving the musculoskeletal system: Secondary | ICD-10-CM | POA: Diagnosis not present

## 2021-09-25 DIAGNOSIS — R29898 Other symptoms and signs involving the musculoskeletal system: Secondary | ICD-10-CM | POA: Diagnosis not present

## 2021-09-30 DIAGNOSIS — Z23 Encounter for immunization: Secondary | ICD-10-CM | POA: Diagnosis not present

## 2021-09-30 DIAGNOSIS — M19042 Primary osteoarthritis, left hand: Secondary | ICD-10-CM | POA: Diagnosis not present

## 2021-09-30 DIAGNOSIS — R29898 Other symptoms and signs involving the musculoskeletal system: Secondary | ICD-10-CM | POA: Diagnosis not present

## 2021-09-30 DIAGNOSIS — M25562 Pain in left knee: Secondary | ICD-10-CM | POA: Diagnosis not present

## 2021-09-30 DIAGNOSIS — E785 Hyperlipidemia, unspecified: Secondary | ICD-10-CM | POA: Diagnosis not present

## 2021-09-30 DIAGNOSIS — I10 Essential (primary) hypertension: Secondary | ICD-10-CM | POA: Diagnosis not present

## 2021-09-30 DIAGNOSIS — Z0000 Encounter for general adult medical examination without abnormal findings: Secondary | ICD-10-CM | POA: Diagnosis not present

## 2021-10-14 DIAGNOSIS — L821 Other seborrheic keratosis: Secondary | ICD-10-CM | POA: Diagnosis not present

## 2021-10-14 DIAGNOSIS — D2239 Melanocytic nevi of other parts of face: Secondary | ICD-10-CM | POA: Diagnosis not present

## 2021-10-14 DIAGNOSIS — D485 Neoplasm of uncertain behavior of skin: Secondary | ICD-10-CM | POA: Diagnosis not present

## 2021-10-14 DIAGNOSIS — D225 Melanocytic nevi of trunk: Secondary | ICD-10-CM | POA: Diagnosis not present

## 2021-10-14 DIAGNOSIS — L719 Rosacea, unspecified: Secondary | ICD-10-CM | POA: Diagnosis not present

## 2021-10-14 DIAGNOSIS — L820 Inflamed seborrheic keratosis: Secondary | ICD-10-CM | POA: Diagnosis not present

## 2021-11-27 DIAGNOSIS — Z1231 Encounter for screening mammogram for malignant neoplasm of breast: Secondary | ICD-10-CM | POA: Diagnosis not present

## 2022-02-05 DIAGNOSIS — F4322 Adjustment disorder with anxiety: Secondary | ICD-10-CM | POA: Diagnosis not present

## 2022-03-10 DIAGNOSIS — N951 Menopausal and female climacteric states: Secondary | ICD-10-CM | POA: Diagnosis not present

## 2022-03-10 DIAGNOSIS — E039 Hypothyroidism, unspecified: Secondary | ICD-10-CM | POA: Diagnosis not present

## 2022-03-17 DIAGNOSIS — R232 Flushing: Secondary | ICD-10-CM | POA: Diagnosis not present

## 2022-03-17 DIAGNOSIS — R6882 Decreased libido: Secondary | ICD-10-CM | POA: Diagnosis not present

## 2022-03-17 DIAGNOSIS — Z6833 Body mass index (BMI) 33.0-33.9, adult: Secondary | ICD-10-CM | POA: Diagnosis not present

## 2022-03-17 DIAGNOSIS — N951 Menopausal and female climacteric states: Secondary | ICD-10-CM | POA: Diagnosis not present

## 2022-03-18 DIAGNOSIS — N951 Menopausal and female climacteric states: Secondary | ICD-10-CM | POA: Diagnosis not present

## 2022-03-29 ENCOUNTER — Telehealth: Payer: BC Managed Care – PPO | Admitting: Physician Assistant

## 2022-03-29 DIAGNOSIS — J208 Acute bronchitis due to other specified organisms: Secondary | ICD-10-CM

## 2022-03-29 DIAGNOSIS — B9689 Other specified bacterial agents as the cause of diseases classified elsewhere: Secondary | ICD-10-CM

## 2022-03-29 MED ORDER — AZITHROMYCIN 250 MG PO TABS: ORAL_TABLET | ORAL | 0 refills | Status: AC

## 2022-03-29 MED ORDER — PREDNISONE 10 MG PO TABS
ORAL_TABLET | ORAL | 0 refills | Status: AC
Start: 2022-03-29 — End: 2022-04-06

## 2022-03-29 MED ORDER — CETIRIZINE HCL 10 MG PO TABS
10.0000 mg | ORAL_TABLET | Freq: Every day | ORAL | 11 refills | Status: AC
Start: 2022-03-29 — End: ?

## 2022-03-29 NOTE — Patient Instructions (Signed)
?Shelley Zimmerman, thank you for joining [name], PA-C for today's virtual visit.  While this provider is not your primary care provider (PCP), if your PCP is located in our provider database this encounter information will be shared with them immediately following your visit. ? ?Consent: ?(Patient) PROMISE WELDIN provided verbal consent for this virtual visit at the beginning of the encounter. ? ?Current Medications: ? ?Current Outpatient Medications:  ?  azithromycin (ZITHROMAX) 250 MG tablet, Take 2 tabs PO day 1, then take 1 tab PO once daily, Disp: 6 tablet, Rfl: 0 ?  cetirizine (ZYRTEC) 10 MG tablet, Take 1 tablet (10 mg total) by mouth daily., Disp: 30 tablet, Rfl: 11 ?  Cholecalciferol (VITAMIN D PO), Take 50,000 Units by mouth once a week., Disp: , Rfl:  ?  LORazepam (ATIVAN) 0.5 MG tablet, Take 1 tablet (0.5 mg total) by mouth at bedtime., Disp: 30 tablet, Rfl: 0 ?  Multiple Vitamin (MULTIVITAMIN) capsule, Take 1 capsule by mouth daily. Reported on 04/29/2016, Disp: , Rfl:  ?  olmesartan (BENICAR) 40 MG tablet, Take 20 mg by mouth daily., Disp: , Rfl:  ?  predniSONE (DELTASONE) 10 MG tablet, Take 4 tablets (40 mg total) by mouth daily with breakfast for 2 days, THEN 3 tablets (30 mg total) daily with breakfast for 2 days, THEN 2 tablets (20 mg total) daily with breakfast for 2 days, THEN 1 tablet (10 mg total) daily with breakfast for 2 days., Disp: 20 tablet, Rfl: 0 ?  Progesterone Micronized (PROGESTERONE PO), Take 50 mg by mouth daily., Disp: , Rfl:  ?  UNABLE TO FIND, 1 Dose 3 (three) times a week. Med Name: Testosterone- estradiol - low dose, Disp: , Rfl:  ?  valACYclovir (VALTREX) 1000 MG tablet, Take 1,000 mg by mouth as needed. , Disp: , Rfl:  ?No current facility-administered medications for this visit. ? ?Facility-Administered Medications Ordered in Other Visits:  ?  gadopentetate dimeglumine (MAGNEVIST) injection 15 mL, 15 mL, Intravenous, Once PRN, Melvenia Beam, MD  ? ?Medications  ordered in this encounter:  ?Meds ordered this encounter  ?Medications  ? cetirizine (ZYRTEC) 10 MG tablet  ?  Sig: Take 1 tablet (10 mg total) by mouth daily.  ?  Dispense:  30 tablet  ?  Refill:  11  ?  Order Specific Question:   Supervising Provider  ?  Answer:   Noemi Chapel [[id number]]  ? azithromycin (ZITHROMAX) 250 MG tablet  ?  Sig: Take 2 tabs PO day 1, then take 1 tab PO once daily  ?  Dispense:  6 tablet  ?  Refill:  0  ?  Order Specific Question:   Supervising Provider  ?  Answer:   Noemi Chapel [[id number]]  ? predniSONE (DELTASONE) 10 MG tablet  ?  Sig: Take 4 tablets (40 mg total) by mouth daily with breakfast for 2 days, THEN 3 tablets (30 mg total) daily with breakfast for 2 days, THEN 2 tablets (20 mg total) daily with breakfast for 2 days, THEN 1 tablet (10 mg total) daily with breakfast for 2 days.  ?  Dispense:  20 tablet  ?  Refill:  0  ?  Order Specific Question:   Supervising Provider  ?  Answer:   Noemi Chapel [[id number]]  ?  ? ?*If you need refills on other medications prior to your next appointment, please contact your pharmacy* ? ?Follow-Up: ?Call back or seek an in-person evaluation if the symptoms worsen or if the  condition fails to improve as anticipated. ? ?Other Instructions ?Unfortunately I am unable to prescribe the Tussionex via a virtual video visit due to it having a narcotic in it.  I encourage you to continue using the Tessalon Perles, and hopefully all the other medications will start giving you relief very soon. ? ?Acute Bronchitis, Adult ? ?Acute bronchitis is sudden inflammation of the main airways (bronchi) that come off the windpipe (trachea) in the lungs. The swelling causes the airways to get smaller and make more mucus than normal. This can make it hard to breathe and can cause coughing or noisy breathing (wheezing). ?Acute bronchitis may last several weeks. The cough may last longer. Allergies, asthma, and exposure to smoke may make the condition worse. ?What are the  causes? ?This condition can be caused by germs and by substances that irritate the lungs, including: ?Cold and flu viruses. The most common cause of this condition is the virus that causes the common cold. ?Bacteria. This is less common. ?Breathing in substances that irritate the lungs, including: ?Smoke from cigarettes and other forms of tobacco. ?Dust and pollen. ?Fumes from household cleaning products, gases, or burned fuel. ?Indoor or outdoor air pollution. ?What increases the risk? ?The following factors may make you more likely to develop this condition: ?A weak body's defense system, also called the immune system. ?A condition that affects your lungs and breathing, such as asthma. ?What are the signs or symptoms? ?Common symptoms of this condition include: ?Coughing. This may bring up clear, yellow, or green mucus from your lungs (sputum). ?Wheezing. ?Runny or stuffy nose. ?Having too much mucus in your lungs (chest congestion). ?Shortness of breath. ?Aches and pains, including sore throat or chest. ?How is this diagnosed? ?This condition is usually diagnosed based on: ?Your symptoms and medical history. ?A physical exam. ?You may also have other tests, including tests to rule out other conditions, such as pneumonia. These tests include: ?A test of lung function. ?Test of a mucus sample to look for the presence of bacteria. ?Tests to check the oxygen level in your blood. ?Blood tests. ?Chest X-ray. ?How is this treated? ?Most cases of acute bronchitis clear up over time without treatment. Your health care provider may recommend: ?Drinking more fluids to help thin your mucus so it is easier to cough up. ?Taking inhaled medicine (inhaler) to improve air flow in and out of your lungs. ?Using a vaporizer or a humidifier. These are machines that add water to the air to help you breathe better. ?Taking a medicine that thins mucus and clears congestion (expectorant). ?Taking a medicine that prevents or stops  coughing (cough suppressant). ?It is notcommon to take an antibiotic medicine for this condition. ?Follow these instructions at home: ? ?Take over-the-counter and prescription medicines only as told by your health care provider. ?Use an inhaler, vaporizer, or humidifier as told by your health care provider. ?Take two teaspoons (10 mL) of honey at bedtime to lessen coughing at night. ?Drink enough fluid to keep your urine pale yellow. ?Do not use any products that contain nicotine or tobacco. These products include cigarettes, chewing tobacco, and vaping devices, such as e-cigarettes. If you need help quitting, ask your health care provider. ?Get plenty of rest. ?Return to your normal activities as told by your health care provider. Ask your health care provider what activities are safe for you. ?Keep all follow-up visits. This is important. ?How is this prevented? ?To lower your risk of getting this condition again: ?Wash your hands  often with soap and water for at least 20 seconds. If soap and water are not available, use hand sanitizer. ?Avoid contact with people who have cold symptoms. ?Try not to touch your mouth, nose, or eyes with your hands. ?Avoid breathing in smoke or chemical fumes. Breathing smoke or chemical fumes will make your condition worse. ?Get the flu shot every year. ?Contact a health care provider if: ?Your symptoms do not improve after 2 weeks. ?You have trouble coughing up the mucus. ?Your cough keeps you awake at night. ?You have a fever. ?Get help right away if you: ?Cough up blood. ?Feel pain in your chest. ?Have severe shortness of breath. ?Faint or keep feeling like you are going to faint. ?Have a severe headache. ?Have a fever or chills that get worse. ?These symptoms may represent a serious problem that is an emergency. Do not wait to see if the symptoms will go away. Get medical help right away. Call your local emergency services (911 in the [place] Do not drive yourself to the  hospital. ?Summary ?Acute bronchitis is inflammation of the main airways (bronchi) that come off the windpipe (trachea) in the lungs. The swelling causes the airways to get smaller and make more mucus than normal. ?Drinking more

## 2022-03-29 NOTE — Progress Notes (Signed)
?Virtual Visit Consent  ? ?Shelley Zimmerman, you are scheduled for a virtual visit with a [facility] provider today.   ?  ?Just as with appointments in the office, your consent must be obtained to participate.  Your consent will be active for this visit and any virtual visit you may have with one of our providers in the next 365 days.   ?  ?If you have a MyChart account, a copy of this consent can be sent to you electronically.  All virtual visits are billed to your insurance company just like a traditional visit in the office.   ? ?As this is a virtual visit, video technology does not allow for your provider to perform a traditional examination.  This may limit your provider's ability to fully assess your condition.  If your provider identifies any concerns that need to be evaluated in person or the need to arrange testing (such as labs, EKG, etc.), we will make arrangements to do so.   ?  ?Although advances in technology are sophisticated, we cannot ensure that it will always work on either your end or our end.  If the connection with a video visit is poor, the visit may have to be switched to a telephone visit.  With either a video or telephone visit, we are not always able to ensure that we have a secure connection.    ? ?I need to obtain your verbal consent now.   Are you willing to proceed with your visit today?  ?  ?Shelley Zimmerman has provided verbal consent on 03/29/2022 for a virtual visit (video or telephone). ?  ?Shelley Rad, PA-C  ? ?Date: 03/29/2022 8:11 PM ? ? ?Virtual Visit via Video Note  ? ?IKennieth Zimmerman, connected with  Shelley Zimmerman  ([id number], 1966-05-15) on 03/29/22 at  7:30 PM EDT by a video-enabled telemedicine application and verified that I am speaking with the correct person using two identifiers. ? ?Location: ?Patient: Virtual Visit Location Patient: Home ?Provider: Virtual Visit Location Provider: Home Office ?  ?I discussed the limitations of evaluation and management by  telemedicine and the availability of in person appointments. The patient expressed understanding and agreed to proceed.   ? ?History of Present Illness: ?Shelley Zimmerman is a 56 y.o. who identifies as a female who was assigned female at birth, and is being seen today for recurrent bronchitis.  States that she has been suffering from a respiratory virus for the past month, states that her cough has been getting worse, describes it as a dry cough.  States the cough is keeping her awake at night.  States that she has a "thick mucus going down my throat".  States that she has been taking Delsym, and Benadryl without much relief.  States that is very common for her and she has these episodes twice a year with the change of seasons.  Denies any sick contacts. ? ?Denies fever, chills, body aches.  States that she is eating and drinking okay. ? ? ?HPI: HPI  ?Problems:  ?Patient Active Problem List  ? Diagnosis Date Noted  ? Degenerative disc disease, cervical 02/07/2016  ? Left sided numbness 12/27/2015  ? Vision changes 12/27/2015  ? Dizziness 12/27/2015  ? Paresthesia 12/27/2015  ? Essential hypertension 12/23/2015  ? Palpitations 12/23/2015  ? History of migraine headaches 12/23/2015  ? Hyperlipidemia 12/23/2015  ? Dizziness and giddiness 11/06/2015  ? Dyspnea and respiratory abnormality 08/16/2015  ? Blue lips 08/16/2015  ?  ?  Allergies:  ?Allergies  ?Allergen Reactions  ? Diovan [Valsartan] Swelling  ?  Facial swelling  ? Wellbutrin [Bupropion]   ?  agitation  ? Zoloft [Sertraline Hcl]   ?  tingling  ? ?Medications:  ?Current Outpatient Medications:  ?  azithromycin (ZITHROMAX) 250 MG tablet, Take 2 tabs PO day 1, then take 1 tab PO once daily, Disp: 6 tablet, Rfl: 0 ?  cetirizine (ZYRTEC) 10 MG tablet, Take 1 tablet (10 mg total) by mouth daily., Disp: 30 tablet, Rfl: 11 ?  Cholecalciferol (VITAMIN D PO), Take 50,000 Units by mouth once a week., Disp: , Rfl:  ?  LORazepam (ATIVAN) 0.5 MG tablet, Take 1 tablet (0.5 mg  total) by mouth at bedtime., Disp: 30 tablet, Rfl: 0 ?  Multiple Vitamin (MULTIVITAMIN) capsule, Take 1 capsule by mouth daily. Reported on 04/29/2016, Disp: , Rfl:  ?  olmesartan (BENICAR) 40 MG tablet, Take 20 mg by mouth daily., Disp: , Rfl:  ?  predniSONE (DELTASONE) 10 MG tablet, Take 4 tablets (40 mg total) by mouth daily with breakfast for 2 days, THEN 3 tablets (30 mg total) daily with breakfast for 2 days, THEN 2 tablets (20 mg total) daily with breakfast for 2 days, THEN 1 tablet (10 mg total) daily with breakfast for 2 days., Disp: 20 tablet, Rfl: 0 ?  Progesterone Micronized (PROGESTERONE PO), Take 50 mg by mouth daily., Disp: , Rfl:  ?  UNABLE TO FIND, 1 Dose 3 (three) times a week. Med Name: Testosterone- estradiol - low dose, Disp: , Rfl:  ?  valACYclovir (VALTREX) 1000 MG tablet, Take 1,000 mg by mouth as needed. , Disp: , Rfl:  ?No current facility-administered medications for this visit. ? ?Facility-Administered Medications Ordered in Other Visits:  ?  gadopentetate dimeglumine (MAGNEVIST) injection 15 mL, 15 mL, Intravenous, Once PRN, Melvenia Beam, MD ? ?Observations/Objective: ?Patient is well-developed, well-nourished in no acute distress.  ?Resting comfortably  at home.  ?Head is normocephalic, atraumatic.  ?No labored breathing.  ?Speech is clear and coherent with logical content.  ?Patient is alert and oriented at baseline.  ? ? ?Assessment and Plan: ?1. Acute bacterial bronchitis ?- cetirizine (ZYRTEC) 10 MG tablet; Take 1 tablet (10 mg total) by mouth daily.  Dispense: 30 tablet; Refill: 11 ?- azithromycin (ZITHROMAX) 250 MG tablet; Take 2 tabs PO day 1, then take 1 tab PO once daily  Dispense: 6 tablet; Refill: 0 ?- predniSONE (DELTASONE) 10 MG tablet; Take 4 tablets (40 mg total) by mouth daily with breakfast for 2 days, THEN 3 tablets (30 mg total) daily with breakfast for 2 days, THEN 2 tablets (20 mg total) daily with breakfast for 2 days, THEN 1 tablet (10 mg total) daily with  breakfast for 2 days.  Dispense: 20 tablet; Refill: 0 ? ?Patient did request Tussionex prescribed, states that she has Tessalon Perles at home but they do not offer relief.  Did explain to patient that we are unable to prescribe this medication through a virtual visit.  Patient education given on supportive care. ? ?Follow Up Instructions: ?I discussed the assessment and treatment plan with the patient. The patient was provided an opportunity to ask questions and all were answered. The patient agreed with the plan and demonstrated an understanding of the instructions.  A copy of instructions were sent to the patient via MyChart unless otherwise noted below.  ? ? ? ?The patient was advised to call back or seek an in-person evaluation if the symptoms worsen or  if the condition fails to improve as anticipated. ? ?Time:  ?I spent 15 minutes with the patient via telehealth technology discussing the above problems/concerns.   ? ?Sakai Heinle S Mayers, PA-C ? ?

## 2022-08-28 DIAGNOSIS — E039 Hypothyroidism, unspecified: Secondary | ICD-10-CM | POA: Diagnosis not present

## 2022-08-28 DIAGNOSIS — N951 Menopausal and female climacteric states: Secondary | ICD-10-CM | POA: Diagnosis not present

## 2022-09-02 DIAGNOSIS — R6882 Decreased libido: Secondary | ICD-10-CM | POA: Diagnosis not present

## 2022-09-02 DIAGNOSIS — Z6833 Body mass index (BMI) 33.0-33.9, adult: Secondary | ICD-10-CM | POA: Diagnosis not present

## 2022-09-02 DIAGNOSIS — R232 Flushing: Secondary | ICD-10-CM | POA: Diagnosis not present

## 2022-09-02 DIAGNOSIS — N951 Menopausal and female climacteric states: Secondary | ICD-10-CM | POA: Diagnosis not present

## 2022-09-10 DIAGNOSIS — H40013 Open angle with borderline findings, low risk, bilateral: Secondary | ICD-10-CM | POA: Diagnosis not present

## 2022-11-22 DIAGNOSIS — R0981 Nasal congestion: Secondary | ICD-10-CM | POA: Diagnosis not present

## 2022-11-22 DIAGNOSIS — R509 Fever, unspecified: Secondary | ICD-10-CM | POA: Diagnosis not present

## 2022-11-22 DIAGNOSIS — R059 Cough, unspecified: Secondary | ICD-10-CM | POA: Diagnosis not present

## 2022-11-22 DIAGNOSIS — J101 Influenza due to other identified influenza virus with other respiratory manifestations: Secondary | ICD-10-CM | POA: Diagnosis not present

## 2023-01-01 DIAGNOSIS — Z0000 Encounter for general adult medical examination without abnormal findings: Secondary | ICD-10-CM | POA: Diagnosis not present

## 2023-02-04 DIAGNOSIS — L659 Nonscarring hair loss, unspecified: Secondary | ICD-10-CM | POA: Diagnosis not present

## 2023-02-04 DIAGNOSIS — L650 Telogen effluvium: Secondary | ICD-10-CM | POA: Diagnosis not present

## 2023-02-18 DIAGNOSIS — L659 Nonscarring hair loss, unspecified: Secondary | ICD-10-CM | POA: Diagnosis not present

## 2023-02-18 DIAGNOSIS — L905 Scar conditions and fibrosis of skin: Secondary | ICD-10-CM | POA: Diagnosis not present

## 2023-03-04 DIAGNOSIS — Z0000 Encounter for general adult medical examination without abnormal findings: Secondary | ICD-10-CM | POA: Diagnosis not present

## 2023-03-04 DIAGNOSIS — F4322 Adjustment disorder with anxiety: Secondary | ICD-10-CM | POA: Diagnosis not present

## 2023-03-04 DIAGNOSIS — I10 Essential (primary) hypertension: Secondary | ICD-10-CM | POA: Diagnosis not present

## 2023-06-03 ENCOUNTER — Other Ambulatory Visit: Payer: Self-pay | Admitting: Obstetrics and Gynecology

## 2023-06-03 ENCOUNTER — Encounter: Payer: Self-pay | Admitting: Obstetrics and Gynecology

## 2023-06-03 DIAGNOSIS — N63 Unspecified lump in unspecified breast: Secondary | ICD-10-CM

## 2023-06-04 DIAGNOSIS — L718 Other rosacea: Secondary | ICD-10-CM | POA: Diagnosis not present

## 2023-06-12 ENCOUNTER — Other Ambulatory Visit: Payer: Self-pay | Admitting: Obstetrics and Gynecology

## 2023-06-12 ENCOUNTER — Ambulatory Visit
Admission: RE | Admit: 2023-06-12 | Discharge: 2023-06-12 | Disposition: A | Discharge status: Home / Self Care | Payer: BC Managed Care – PPO | Source: Ambulatory Visit | Attending: Obstetrics and Gynecology | Admitting: Obstetrics and Gynecology

## 2023-06-12 DIAGNOSIS — M7989 Other specified soft tissue disorders: Secondary | ICD-10-CM

## 2023-06-12 DIAGNOSIS — N63 Unspecified lump in unspecified breast: Secondary | ICD-10-CM

## 2023-06-12 DIAGNOSIS — R2232 Localized swelling, mass and lump, left upper limb: Secondary | ICD-10-CM | POA: Diagnosis not present

## 2023-06-23 DIAGNOSIS — Z6834 Body mass index (BMI) 34.0-34.9, adult: Secondary | ICD-10-CM | POA: Diagnosis not present

## 2023-06-23 DIAGNOSIS — Z124 Encounter for screening for malignant neoplasm of cervix: Secondary | ICD-10-CM | POA: Diagnosis not present

## 2023-06-23 DIAGNOSIS — Z01419 Encounter for gynecological examination (general) (routine) without abnormal findings: Secondary | ICD-10-CM | POA: Diagnosis not present

## 2023-08-03 DIAGNOSIS — H1032 Unspecified acute conjunctivitis, left eye: Secondary | ICD-10-CM | POA: Diagnosis not present

## 2023-08-18 DIAGNOSIS — I1A0 Resistant hypertension: Secondary | ICD-10-CM | POA: Diagnosis not present

## 2023-08-18 DIAGNOSIS — L814 Other melanin hyperpigmentation: Secondary | ICD-10-CM | POA: Diagnosis not present

## 2023-08-18 DIAGNOSIS — I10 Essential (primary) hypertension: Secondary | ICD-10-CM | POA: Diagnosis not present

## 2023-08-18 DIAGNOSIS — L659 Nonscarring hair loss, unspecified: Secondary | ICD-10-CM | POA: Diagnosis not present

## 2023-08-19 DIAGNOSIS — I10 Essential (primary) hypertension: Secondary | ICD-10-CM | POA: Diagnosis not present

## 2023-10-06 DIAGNOSIS — J988 Other specified respiratory disorders: Secondary | ICD-10-CM | POA: Diagnosis not present

## 2023-10-06 DIAGNOSIS — R0982 Postnasal drip: Secondary | ICD-10-CM | POA: Diagnosis not present

## 2023-10-06 DIAGNOSIS — R059 Cough, unspecified: Secondary | ICD-10-CM | POA: Diagnosis not present

## 2023-10-06 DIAGNOSIS — R0981 Nasal congestion: Secondary | ICD-10-CM | POA: Diagnosis not present

## 2023-10-09 DIAGNOSIS — R059 Cough, unspecified: Secondary | ICD-10-CM | POA: Diagnosis not present

## 2023-10-09 DIAGNOSIS — J988 Other specified respiratory disorders: Secondary | ICD-10-CM | POA: Diagnosis not present

## 2023-10-09 DIAGNOSIS — Z789 Other specified health status: Secondary | ICD-10-CM | POA: Diagnosis not present

## 2023-10-10 DIAGNOSIS — R0981 Nasal congestion: Secondary | ICD-10-CM | POA: Diagnosis not present

## 2023-10-10 DIAGNOSIS — M25512 Pain in left shoulder: Secondary | ICD-10-CM | POA: Diagnosis not present

## 2023-10-10 DIAGNOSIS — R051 Acute cough: Secondary | ICD-10-CM | POA: Diagnosis not present

## 2023-10-10 DIAGNOSIS — J209 Acute bronchitis, unspecified: Secondary | ICD-10-CM | POA: Diagnosis not present

## 2023-10-21 DIAGNOSIS — Z79899 Other long term (current) drug therapy: Secondary | ICD-10-CM | POA: Diagnosis not present

## 2023-10-21 DIAGNOSIS — I10 Essential (primary) hypertension: Secondary | ICD-10-CM | POA: Diagnosis not present

## 2023-10-21 DIAGNOSIS — J40 Bronchitis, not specified as acute or chronic: Secondary | ICD-10-CM | POA: Diagnosis not present
